# Patient Record
Sex: Male | Born: 2000 | Race: White | Hispanic: No | Marital: Single | Smoking: Never smoker
Health system: Southern US, Academic
[De-identification: ages and names within clinical notes are randomized; demographics above are authoritative.]

## PROBLEM LIST (undated history)

## (undated) DIAGNOSIS — R7303 Prediabetes: Secondary | ICD-10-CM

## (undated) DIAGNOSIS — F419 Anxiety disorder, unspecified: Secondary | ICD-10-CM

## (undated) DIAGNOSIS — K760 Fatty (change of) liver, not elsewhere classified: Secondary | ICD-10-CM

## (undated) DIAGNOSIS — Z9289 Personal history of other medical treatment: Secondary | ICD-10-CM

## (undated) DIAGNOSIS — M51369 Other intervertebral disc degeneration, lumbar region without mention of lumbar back pain or lower extremity pain: Secondary | ICD-10-CM

## (undated) DIAGNOSIS — L83 Acanthosis nigricans: Secondary | ICD-10-CM

## (undated) DIAGNOSIS — I10 Essential (primary) hypertension: Secondary | ICD-10-CM

## (undated) DIAGNOSIS — R7989 Other specified abnormal findings of blood chemistry: Secondary | ICD-10-CM

## (undated) HISTORY — DX: Personal history of other medical treatment: Z92.89

## (undated) HISTORY — DX: Fatty (change of) liver, not elsewhere classified: K76.0

## (undated) HISTORY — DX: Acanthosis nigricans: L83

## (undated) HISTORY — DX: Anxiety disorder, unspecified: F41.9

## (undated) HISTORY — DX: Prediabetes: R73.03

## (undated) HISTORY — PX: TEAR DUCT SURGERY: SHX793

## (undated) HISTORY — DX: Essential (primary) hypertension: I10

## (undated) HISTORY — DX: Other intervertebral disc degeneration, lumbar region without mention of lumbar back pain or lower extremity pain: M51.369

## (undated) HISTORY — DX: Other specified abnormal findings of blood chemistry: R79.89

---
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
## 2021-05-08 ENCOUNTER — Other Ambulatory Visit (HOSPITAL_COMMUNITY): Payer: Self-pay | Admitting: FAMILY PRACTICE

## 2021-05-08 DIAGNOSIS — R7989 Other specified abnormal findings of blood chemistry: Secondary | ICD-10-CM

## 2021-06-17 ENCOUNTER — Inpatient Hospital Stay
Admission: RE | Admit: 2021-06-17 | Discharge: 2021-06-17 | Disposition: A | Discharge status: Home / Self Care | Payer: 59 | Source: Ambulatory Visit | Attending: FAMILY PRACTICE | Admitting: FAMILY PRACTICE

## 2021-06-17 ENCOUNTER — Other Ambulatory Visit: Payer: Self-pay

## 2021-06-17 DIAGNOSIS — R7989 Other specified abnormal findings of blood chemistry: Secondary | ICD-10-CM | POA: Insufficient documentation

## 2021-08-04 ENCOUNTER — Other Ambulatory Visit: Payer: Self-pay

## 2021-08-04 ENCOUNTER — Other Ambulatory Visit: Payer: 59 | Attending: FAMILY PRACTICE

## 2022-03-23 ENCOUNTER — Other Ambulatory Visit (HOSPITAL_COMMUNITY): Payer: Self-pay | Admitting: FAMILY PRACTICE

## 2022-03-23 ENCOUNTER — Inpatient Hospital Stay
Admission: RE | Admit: 2022-03-23 | Discharge: 2022-03-23 | Disposition: A | Discharge status: Home / Self Care | Payer: 59 | Source: Ambulatory Visit | Attending: FAMILY PRACTICE | Admitting: FAMILY PRACTICE

## 2022-03-23 ENCOUNTER — Other Ambulatory Visit: Payer: Self-pay

## 2022-03-23 ENCOUNTER — Inpatient Hospital Stay (HOSPITAL_COMMUNITY)
Admission: RE | Admit: 2022-03-23 | Discharge: 2022-03-23 | Disposition: A | Discharge status: Home / Self Care | Payer: 59 | Source: Ambulatory Visit | Attending: FAMILY PRACTICE | Admitting: FAMILY PRACTICE

## 2022-03-23 DIAGNOSIS — M546 Pain in thoracic spine: Secondary | ICD-10-CM

## 2022-03-23 DIAGNOSIS — R52 Pain, unspecified: Secondary | ICD-10-CM | POA: Insufficient documentation

## 2022-04-17 IMAGING — MR MRI THORACIC SPINE WITHOUT CONTRAST
4 of 5 series · 19 of 48 positions shown · non-contrast
Comparison: None available.

﻿EXAM:  [DATE]   MRI THORACIC SPINE WITHOUT CONTRAST,MRI LUMBAR SPINE WITHOUT CONTRAST
INDICATION: Mid and low back pain, bilateral hip and leg pain.
TECHNIQUE: Noncontrast multiplanar, multisequence MRI was performed.

[Series 7: T1 · sagittal · 5.0mm · 0.78mm/px · 3 of 11 slices shown (1 of 2)]
[im 2/11]
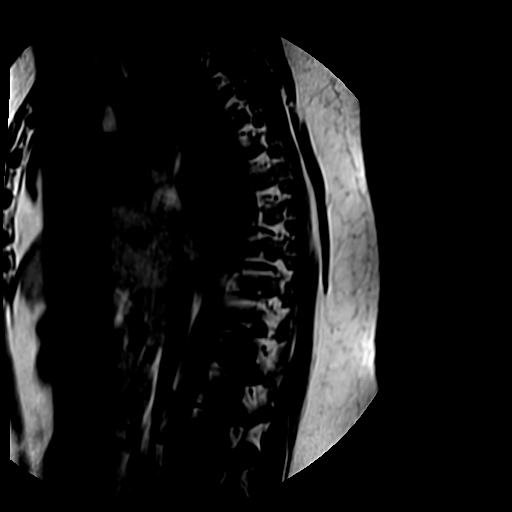
[im 6/11]
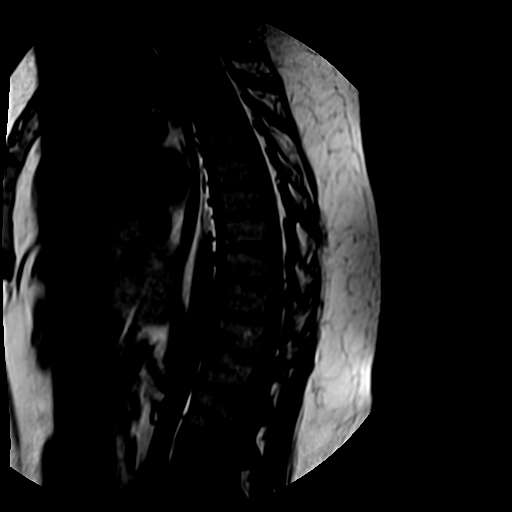
[im 9/11]
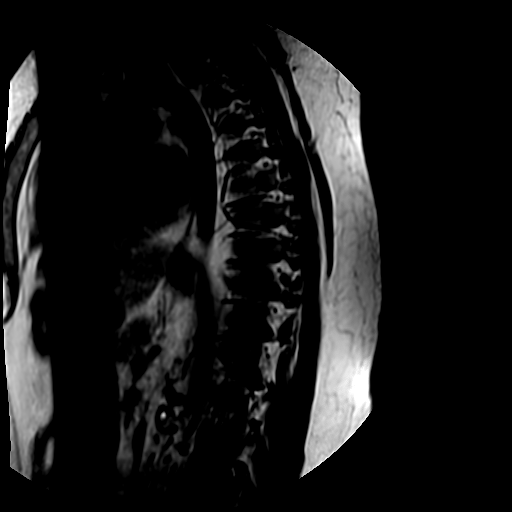

[Series 9: T2 · sagittal · 5.0mm · 0.78mm/px · 9 of 11 slices shown (1 of 2)]
[im 1/11]
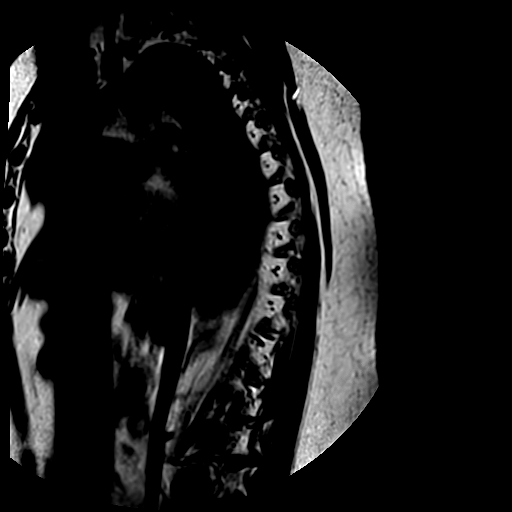
[im 2/11]
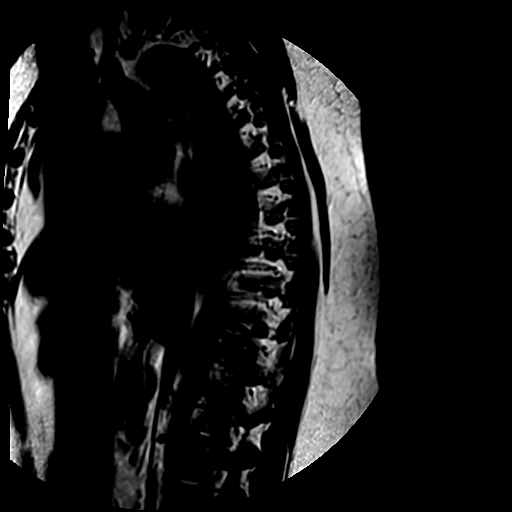
[im 3/11]
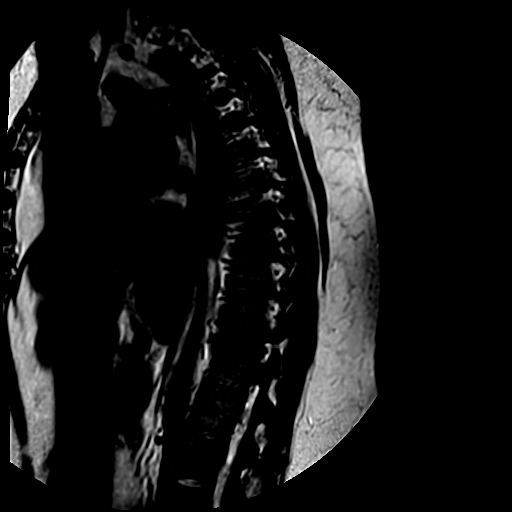
[im 4/11]
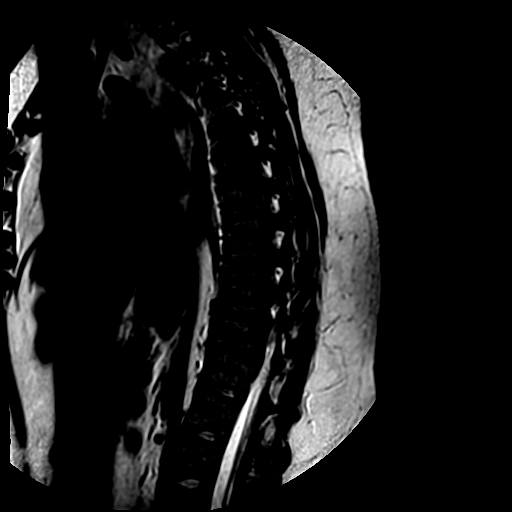
[im 6/11]
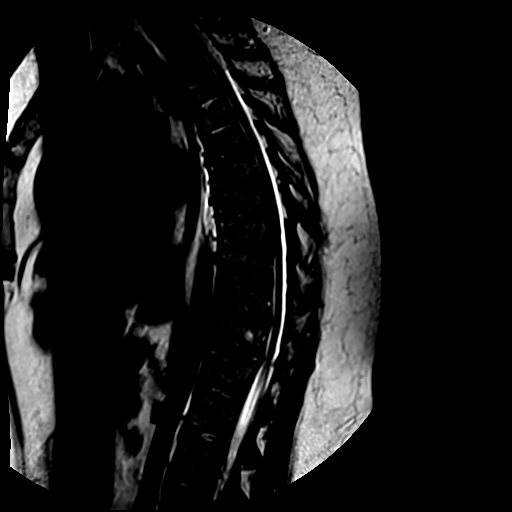
[im 7/11]
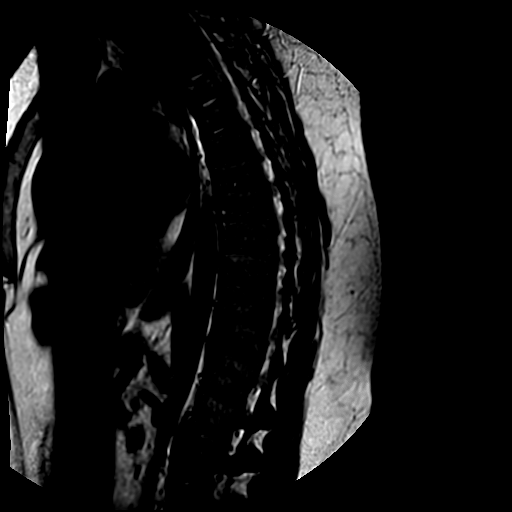
[im 8/11]
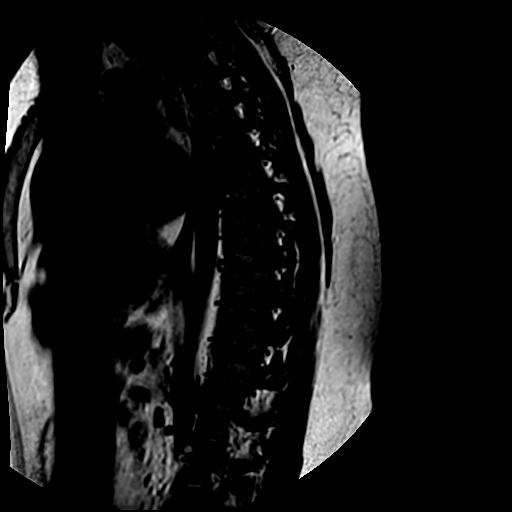
[im 9/11]
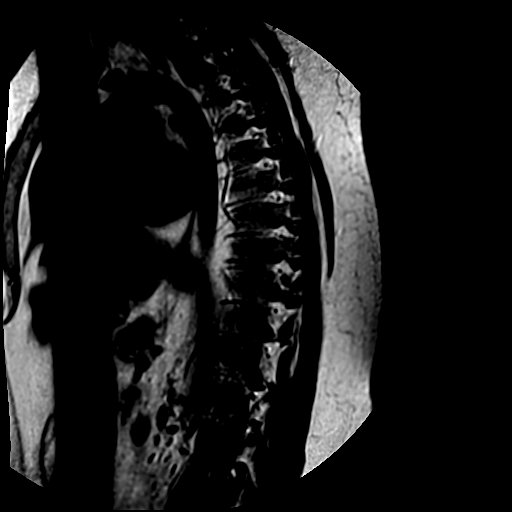
[im 11/11]
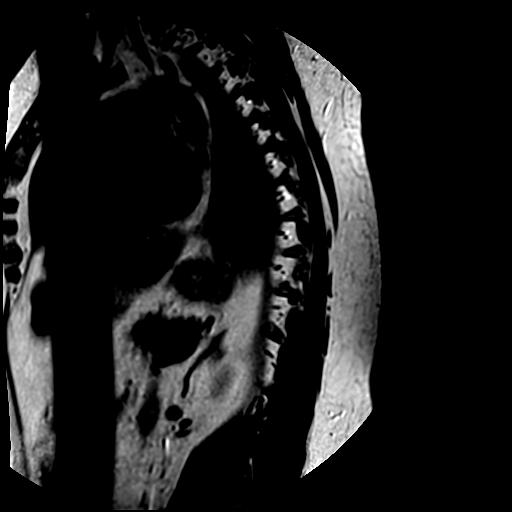

[Series 10: T1 · axial · 5.0mm · 0.45mm/px · z∈[-303,-164]mm · 3 of 13 slices shown (2 of 2)]
[im 2/13]
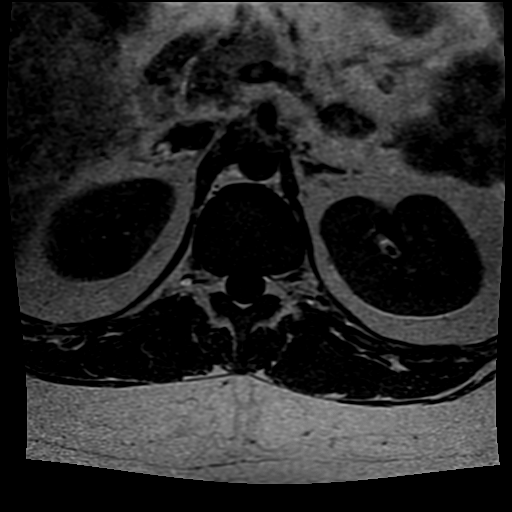
[im 7/13]
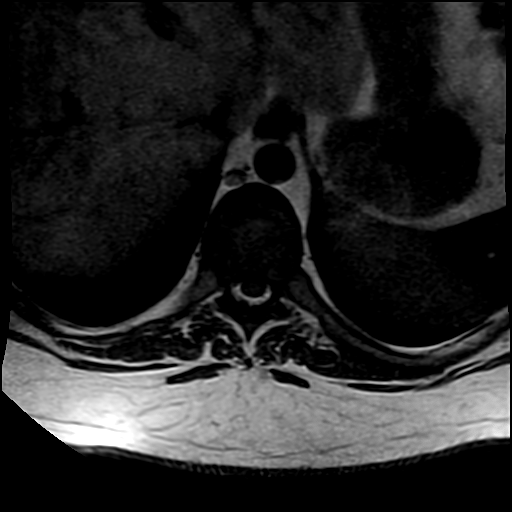
[im 11/13]
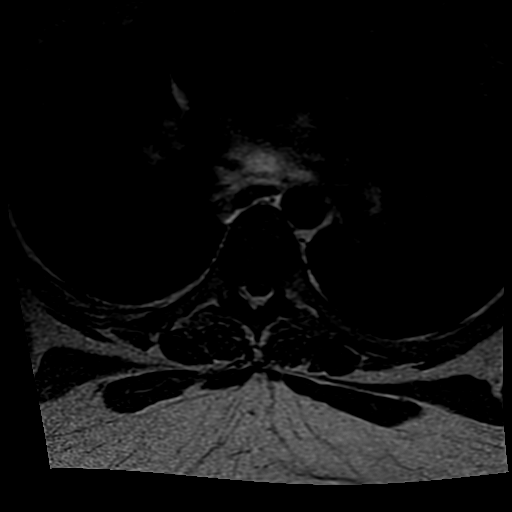

[Series 11: T2 · axial · 5.0mm · 0.45mm/px · z∈[-339,-164]mm · 4 of 12 slices shown (2 of 2)]
[im 1/12]
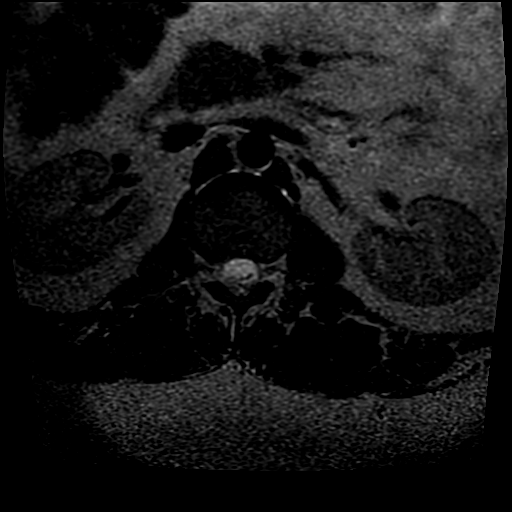
[im 2/12]
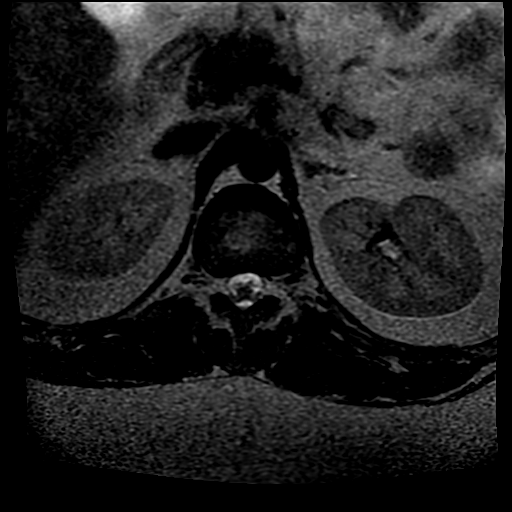
[im 7/12]
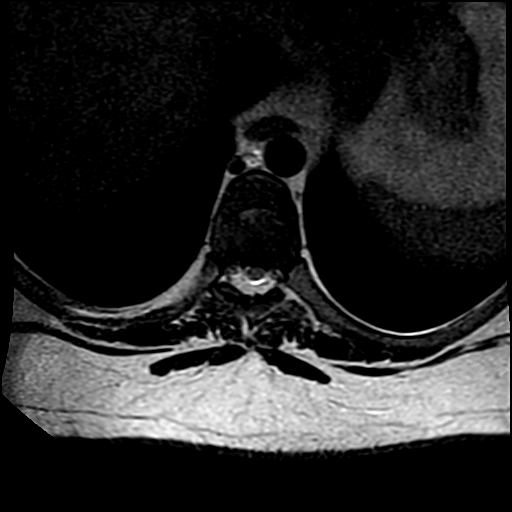
[im 10/12]
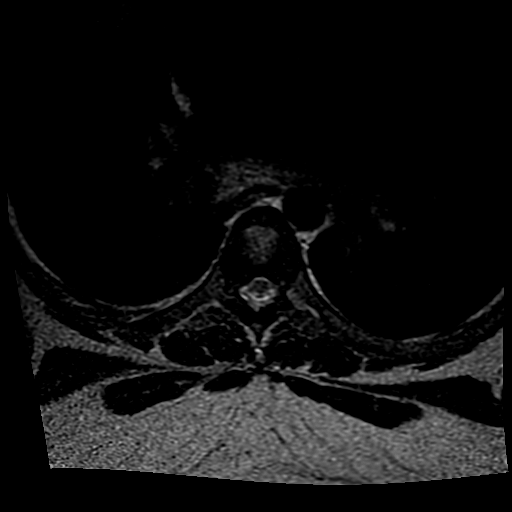

[19 of 48 positions shown; findings below may reference images not displayed]

FINDINGS: This examination is limited by use of the body coil required by the patient’s large size.  As a result, image quality is limited.  

There are mild degenerative changes at multiple levels.  No fracture or malalignment is seen.  

At T10-T11, there is a moderate sized right paratracheal disc herniation with compression of the right side of the thecal sac and possible compression of the right nerve root at this level.  

At L5-S1, there is a moderate sized right paracentral disc herniation with slight compression of the thecal sac and probable compression of the right S1 nerve root.  

There is no fracture, malalignment, significant marrow signal alteration, or spinal stenosis.
IMPRESSION: T10-T11 and L5-S1 disc herniations.

## 2022-04-17 IMAGING — MR MRI LUMBAR SPINE WITHOUT CONTRAST
6 series · 48 of 48 positions shown · non-contrast
Comparison: None available.

﻿EXAM:  [DATE]   MRI THORACIC SPINE WITHOUT CONTRAST,MRI LUMBAR SPINE WITHOUT CONTRAST
INDICATION: Mid and low back pain, bilateral hip and leg pain.
TECHNIQUE: Noncontrast multiplanar, multisequence MRI was performed.

[Series 4: T2 · sagittal · 5.0mm · 1.00mm/px · 5 of 13 slices shown (1 of 3)]
[im 1/13]
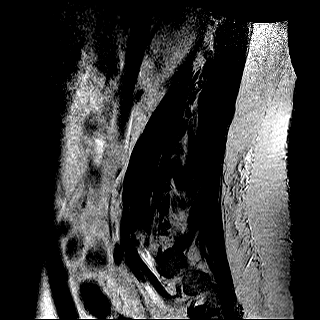
[im 4/13]
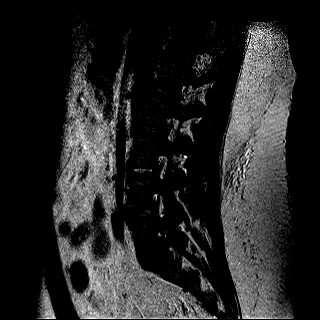
[im 7/13]
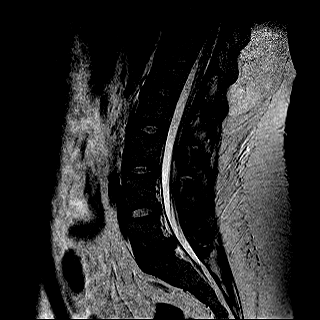
[im 10/13]
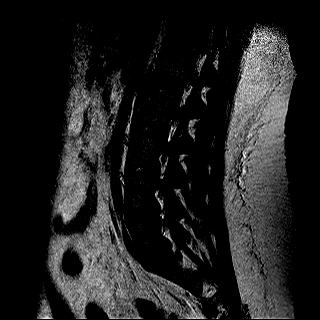
[im 13/13]
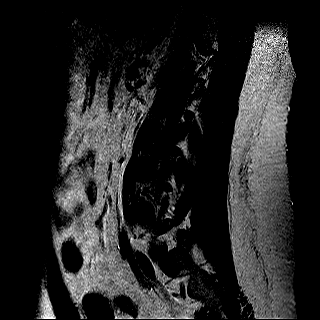

[Series 5: T1 · sagittal · 5.0mm · 1.00mm/px · 6 of 13 slices shown (1 of 2)]
[im 1/13]
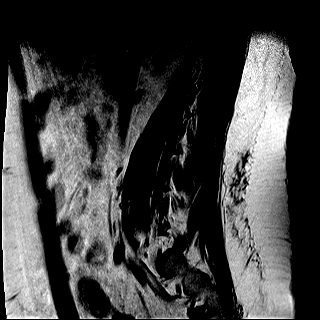
[im 3/13]
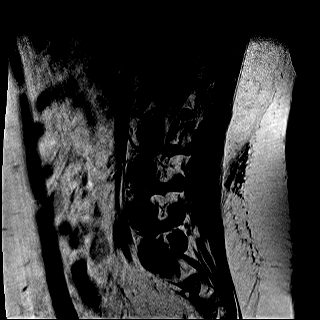
[im 5/13]
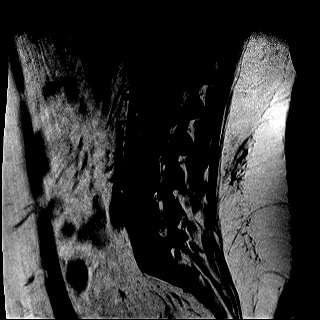
[im 8/13]
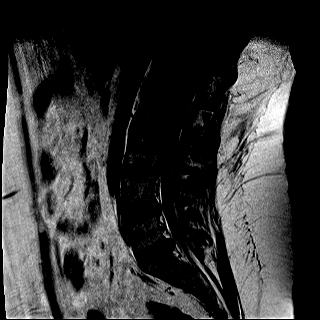
[im 10/13]
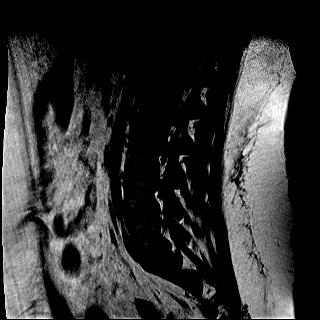
[im 13/13]
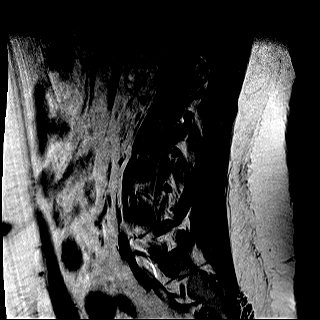

[Series 6: STIR · sagittal · 5.0mm · 1.25mm/px · 6 of 13 slices shown]
[im 1/13]
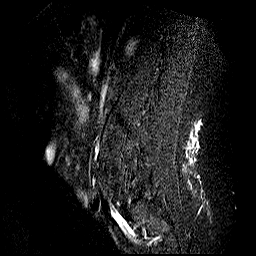
[im 3/13]
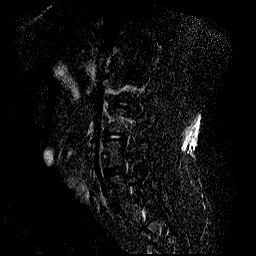
[im 5/13]
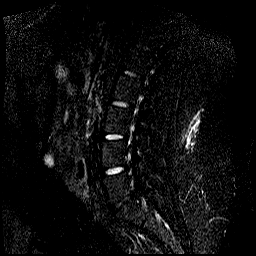
[im 8/13]
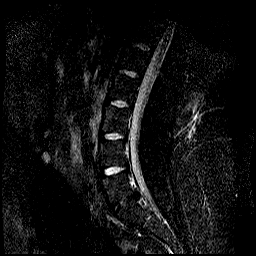
[im 10/13]
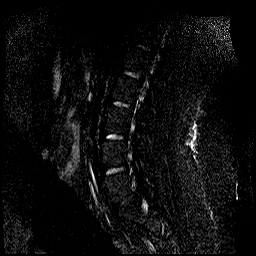
[im 13/13]
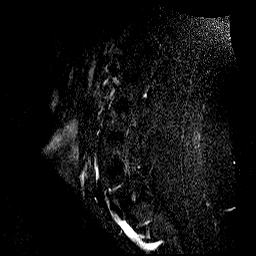

[Series 7: T2 · coronal · 4.0mm · 1.88mm/px · 9 of 20 slices shown (2 of 3)]
[im 1/20]
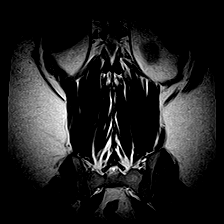
[im 3/20]
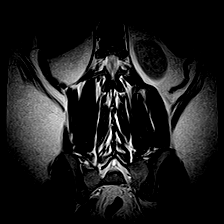
[im 5/20]
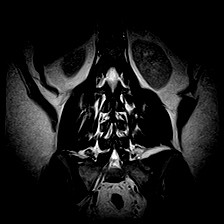
[im 8/20]
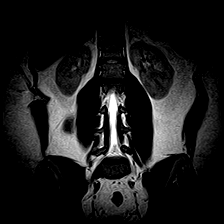
[im 10/20]
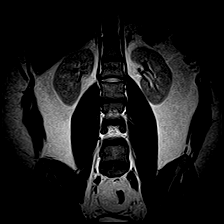
[im 12/20]
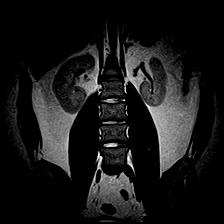
[im 15/20]
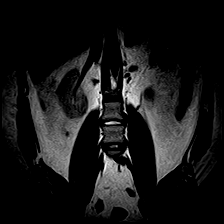
[im 17/20]
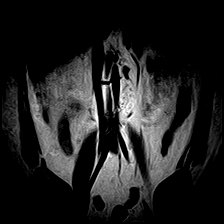
[im 20/20]
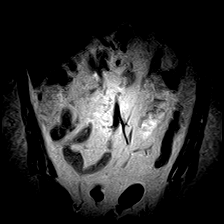

[Series 8: T2 · axial · 5.0mm · 0.89mm/px · z∈[-94,+161]mm · 11 of 25 slices shown (3 of 3)]
[im 1/25]
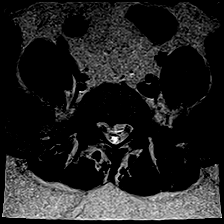
[im 3/25]
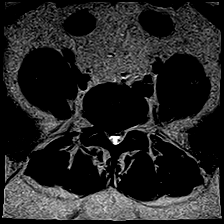
[im 5/25]
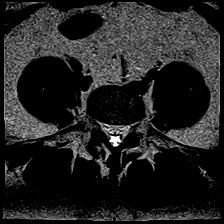
[im 8/25]
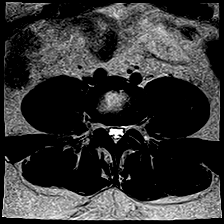
[im 10/25]
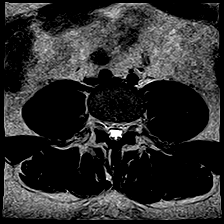
[im 13/25]
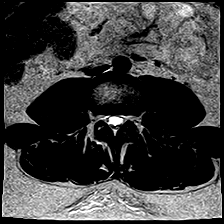
[im 15/25]
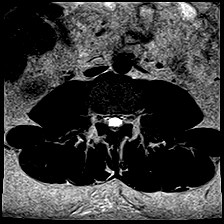
[im 17/25]
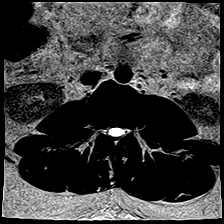
[im 20/25]
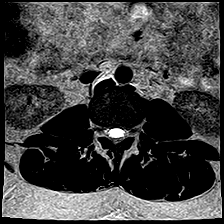
[im 22/25]
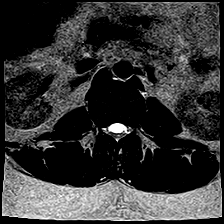
[im 25/25]
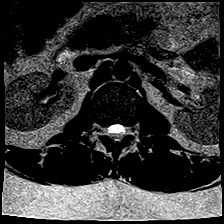

[Series 9: T1 · axial · 5.0mm · 0.89mm/px · z∈[-94,+161]mm · 11 of 25 slices shown (2 of 2)]
[im 1/25]
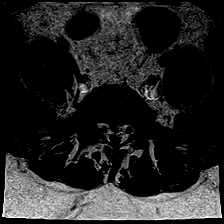
[im 3/25]
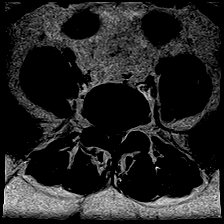
[im 5/25]
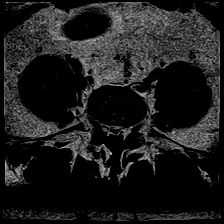
[im 8/25]
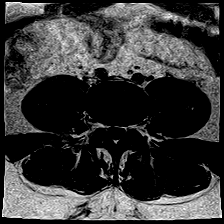
[im 10/25]
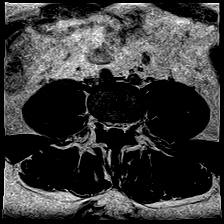
[im 13/25]
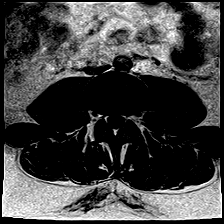
[im 15/25]
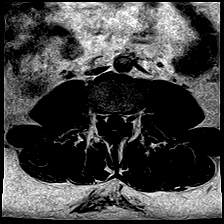
[im 17/25]
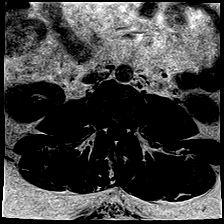
[im 20/25]
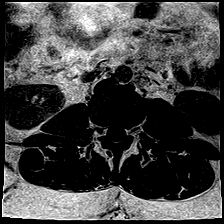
[im 22/25]
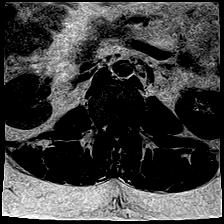
[im 25/25]
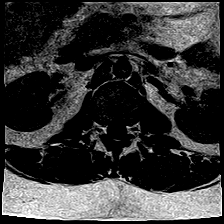

[48 of 48 positions shown; findings below may reference images not displayed]

FINDINGS: This examination is limited by use of the body coil required by the patient’s large size.  As a result, image quality is limited.  

There are mild degenerative changes at multiple levels.  No fracture or malalignment is seen.  

At T10-T11, there is a moderate sized right paratracheal disc herniation with compression of the right side of the thecal sac and possible compression of the right nerve root at this level.  

At L5-S1, there is a moderate sized right paracentral disc herniation with slight compression of the thecal sac and probable compression of the right S1 nerve root.  

There is no fracture, malalignment, significant marrow signal alteration, or spinal stenosis.
IMPRESSION: T10-T11 and L5-S1 disc herniations.

## 2022-04-28 ENCOUNTER — Ambulatory Visit (INDEPENDENT_AMBULATORY_CARE_PROVIDER_SITE_OTHER): Payer: Self-pay

## 2022-04-28 NOTE — Telephone Encounter (Signed)
Patient notified of appt date and time.    Rozanna Cormany, MA

## 2022-05-04 ENCOUNTER — Ambulatory Visit: Payer: BC Managed Care – PPO | Attending: Neurological Surgery | Admitting: Neurological Surgery

## 2022-05-04 ENCOUNTER — Other Ambulatory Visit: Payer: Self-pay

## 2022-05-04 ENCOUNTER — Encounter (INDEPENDENT_AMBULATORY_CARE_PROVIDER_SITE_OTHER): Payer: Self-pay | Admitting: Neurological Surgery

## 2022-05-04 VITALS — BP 110/76 | HR 130 | Temp 97.0°F | Ht 75.0 in | Wt >= 6400 oz

## 2022-05-04 DIAGNOSIS — M5124 Other intervertebral disc displacement, thoracic region: Secondary | ICD-10-CM

## 2022-05-04 DIAGNOSIS — M5117 Intervertebral disc disorders with radiculopathy, lumbosacral region: Secondary | ICD-10-CM

## 2022-05-04 DIAGNOSIS — M5126 Other intervertebral disc displacement, lumbar region: Secondary | ICD-10-CM | POA: Insufficient documentation

## 2022-05-04 DIAGNOSIS — M546 Pain in thoracic spine: Secondary | ICD-10-CM | POA: Insufficient documentation

## 2022-05-04 DIAGNOSIS — R202 Paresthesia of skin: Secondary | ICD-10-CM | POA: Insufficient documentation

## 2022-05-04 DIAGNOSIS — M549 Dorsalgia, unspecified: Secondary | ICD-10-CM | POA: Insufficient documentation

## 2022-05-04 DIAGNOSIS — M5416 Radiculopathy, lumbar region: Secondary | ICD-10-CM | POA: Insufficient documentation

## 2022-05-04 NOTE — Progress Notes (Signed)
[facility] Department of Neurosurgery  New Outpatient/Consult    Doyle Askew  Date of Service: 05/04/2022  Referring physician: No referring provider defined for this encounter.    Gender: male  Handedness: Right handed  Marital Status: Single   Job Title (or Former Job): Quarry manager Complaint:   Chief Complaint   Patient presents with    New Patient     History is provided by patient    History of Present Illness  Corey Lopez is a 22 y/o right handed male here today for evaluation thoracic/lumbar spine. Initially reports: mid and low back pain along right numbness  Prior spine surgeries: no  has been occurring for 4 weeks without inciting event-  Located mid low back sharp muscle spasm pain  Radiates to RLE numbness  Occurs with prolong activity: walking, sitting and standing.  Bending makes worse.   Relieved by changing positions  Numbness- RLE  Weakness- no  No assistive devices.   Bowel/bladder dysfunction-no   Falls/injuries-no    Conservative treatments:   Meds- robaxin helps, Tylenol and Naproxen with some benefit.   Heat/ice- heat helps.  Chiropractic- no  PT- nothing recent.   TENs- nothing reported  injections/ pain clinic- no  Prior testing EMG/NCS- no    Today, the patient is doing fair. Pain is rated as a 3/10. Patient denies fevers, nausea, vomiting, diarrhea, SOB and bowel or bladder dysfunction. No reported falls, seizures, blurry vision or headaches. No Tobacco use.    Past History    Current Outpatient Medications:     busPIRone (BUSPAR) 15 mg Oral Tablet, , Disp: , Rfl:     ketoconazole (NIZORAL) 2 % Shampoo, , Disp: , Rfl:     metFORMIN (GLUCOPHAGE) 500 mg Oral Tablet, , Disp: , Rfl:     methocarbamoL (ROBAXIN) 750 mg Oral Tablet, , Disp: , Rfl:     omeprazole (PRILOSEC) 40 mg Oral Capsule, Delayed Release(E.C.), , Disp: , Rfl:     venlafaxine (EFFEXOR XR) 37.5 mg Oral Capsule, Sust. Release 24 hr, , Disp: , Rfl:    No Known Allergies  Past Medical History:   Diagnosis Date    Acanthosis  nigricans      No past surgical history on file.    Family History  Family Medical History:       Problem Relation (Age of Onset)    Diabetes type II Mother, Father    Goiter Mother    Healthy Brother    Other cancer Maternal Grandmother    Thyroid Disease Maternal Grandmother          Social History  Social History     Socioeconomic History    Marital status: Single   Tobacco Use    Smoking status: Never    Smokeless tobacco: Never   Substance and Sexual Activity    Alcohol use: Yes     Comment: occasionally    Drug use: Never   Social History Narrative    02/19/16 - Lives at home with mother, father, and younger brother.  Sophomore in high school.     Review of Systems  Other than ROS in the HPI, all other systems were negative.    Examination  BP 110/76   Pulse (!) 130   Temp 36.1 C (97 F) (Thermal Scan)   Ht 1.905 m (6\' 3" )   Wt (!) 188 kg (414 lb 11 oz)   BMI 51.83 kg/m     Constitutional:Well groomed, in no apparent  distress  Head:  Normocephalic/ Atraumatic.   Eyes: Conjunctiva clear, + red reflex, optic discs and posterior segments - difficult to visualize, no obvious abnormalities;   ENT:  Tongue midline, trachea midline   Cardiovascular:    Carotid arteries: Negative for carotid bruit bilaterally  Auscultation: Regular rate and rhythm  Peripheral vascular system: Pulses palpable, cap refill intact  Respiratory: CTA bilaterally, Unlabored  Gastrointestinal: BS x 4  Musculoskeletal  Gait and Station: slow and steady with no ataxia  Strength: Age appropriate  Arm Right Left Leg Right Left   Deltoid (shoulder abduction) 5/5 5/5 Hip Flexion 5/5 5/5   Biceps (Elbow flexion) 5/5 5/5 Knee Flexion 5/5 5/5   Triceps (Elbow extension) 5/5 5/5 Knee Extension 5/5 5/5   Grip 5/5 5/5 Foot Dorsiflexion 5/5 5/5      Foot Plantarflexion 5/5 5/5   Muscle tone (upper extremities): WNL  Muscle tone (lower extremities): WNL  Sensory: intact to light touch, Right thigh numbness  DTR's:    Brachio-  radialis Biceps Triceps  Patellar Achilles  Hoffman's   Right 2+ 2+ 2+ 2+ 2+  Not present   Left 2+ 2+ 2+ 2+ 2+  Not present   Ankle clonus: Negative  Coordination: Normal rapid alternating movements and finger to nose intact. No pronator drift.   Romberg: maintains balance with eyes closed   Straight leg raise: Negative   Musculoskeletal tenderness: negative  Neurological  Level of consciousness: Alert and oriented  Recent and remote memory: Good recall and able to follow commands  Attention span and concentration: Normal in conversation  Language/Speech: No aphasia or dysarthria  Fund of knowledge: Appropriate in this setting  Cranial Nerves  2nd: PERRL  3rd,4th,6th:  EOMI, no nystagmus  5th: Facial sensation intact  7th: No facial asymmetry or droop  8th: Hearing grossly intact  9th/ 10th: Palate symmetric  11th: Normal shoulder shrug  12th: Tongue midline    Data reviewed  MRI thoracic and lumbar reviewed and discussed with patient by Dr. Cherrie DistanceVoelker. Official report in EPIC.     Discussions with other providers:   Prior notes and charts reviewed.     Assessment:      ICD-10-CM    1. Back pain, unspecified back location, unspecified back pain laterality, unspecified chronicity  M54.9 External Referral to Physical Therapy for Orthopaedics Spine Patients      2. Right leg paresthesias  R20.2 External Referral to Physical Therapy for Orthopaedics Spine Patients      3. Thoracic back pain  M54.6 External Referral to Physical Therapy for Orthopaedics Spine Patients      4. Lumbar disc herniation  M51.26 External Referral to Physical Therapy for Orthopaedics Spine Patients      5. Lumbar radiculopathy  M54.16 External Referral to Physical Therapy for Orthopaedics Spine Patients        Treatment Plan  - See Dr. Nickie RetortVoelker's note for elaboration of plan. A physical therapy prescription was provided to the patient today.    - Telephone visit in 6 weeks after a trial of PT. Come back earlier if problems develop.     - Continue Medical Management (Diet,  Exercise, Medication).    - The patient has been advised to follow up with their PCP in regards any chronic medical conditions and any non-neurosurgical symptoms that they may have. A copy of the note from today's clinic appointment will be sent to the patient's PCP Telitha D Glasscock, DO on file (confirmed during visit)  The patient was seen as a shared visit with the co-signing faculty.    Doreene Nest, APRN,FNP-BC 05/04/2022, 14:57    With Dr. Cherrie Distance    I personally saw and examined the patient. See the mid-level provider's note for additional details. My findings are as follows:     History:  1 month history of LBP to anterior right thigh after lifting couch. LBP>leg pain.     Exam:  motor and sensory intact.     Studies:  MRI shows right L5-S1 HNP, small right T10-11 HNP.     Imp:  right L5-S1 HNP.      Plan:  PT, phone visit in 6 weeks.        Barnabas Lister, MD 05/04/2022, 14:55

## 2022-05-04 NOTE — Progress Notes (Signed)
I personally saw and examined the patient. See the mid-level provider's note for additional details. My findings are as follows:    History:  1 month history of LBP to anterior right thigh after lifting couch. LBP>leg pain.    Exam:  motor and sensory intact.    Studies:  MRI shows right L5-S1 HNP, small right T10-11 HNP.    Imp:  right L5-S1 HNP.     Plan:  PT, phone visit in 6 weeks.      Barnabas Lister, MD 05/04/2022, 14:55

## 2022-06-15 ENCOUNTER — Ambulatory Visit (INDEPENDENT_AMBULATORY_CARE_PROVIDER_SITE_OTHER): Payer: BC Managed Care – PPO | Admitting: Neurological Surgery

## 2022-11-05 ENCOUNTER — Encounter (INDEPENDENT_AMBULATORY_CARE_PROVIDER_SITE_OTHER): Payer: Self-pay | Admitting: PHYSICIAN ASSISTANT

## 2022-11-12 ENCOUNTER — Encounter (INDEPENDENT_AMBULATORY_CARE_PROVIDER_SITE_OTHER): Payer: Self-pay | Admitting: PHYSICIAN ASSISTANT

## 2022-12-02 ENCOUNTER — Ambulatory Visit (INDEPENDENT_AMBULATORY_CARE_PROVIDER_SITE_OTHER): Payer: BC Managed Care – PPO | Admitting: PHYSICIAN ASSISTANT

## 2022-12-02 ENCOUNTER — Other Ambulatory Visit: Payer: Self-pay

## 2022-12-02 ENCOUNTER — Encounter (INDEPENDENT_AMBULATORY_CARE_PROVIDER_SITE_OTHER): Payer: Self-pay | Admitting: PHYSICIAN ASSISTANT

## 2022-12-02 VITALS — BP 170/94 | HR 99 | Temp 97.5°F | Resp 19 | Ht 75.0 in | Wt >= 6400 oz

## 2022-12-02 DIAGNOSIS — R030 Elevated blood-pressure reading, without diagnosis of hypertension: Secondary | ICD-10-CM

## 2022-12-02 DIAGNOSIS — Z23 Encounter for immunization: Secondary | ICD-10-CM

## 2022-12-02 DIAGNOSIS — F419 Anxiety disorder, unspecified: Secondary | ICD-10-CM

## 2022-12-02 DIAGNOSIS — J029 Acute pharyngitis, unspecified: Secondary | ICD-10-CM

## 2022-12-02 DIAGNOSIS — Z634 Disappearance and death of family member: Secondary | ICD-10-CM

## 2022-12-02 MED ORDER — BUSPIRONE 15 MG TABLET
15.0000 mg | ORAL_TABLET | Freq: Three times a day (TID) | ORAL | 0 refills | Status: DC
Start: 2022-12-02 — End: 2023-05-19

## 2022-12-02 MED ORDER — NICOTINE 21 MG/24 HR DAILY TRANSDERMAL PATCH
21.0000 mg | MEDICATED_PATCH | Freq: Every day | TRANSDERMAL | 1 refills | Status: DC
Start: 2022-12-02 — End: 2022-12-02

## 2022-12-02 MED ORDER — VENLAFAXINE ER 37.5 MG CAPSULE,EXTENDED RELEASE 24 HR
37.5000 mg | ORAL_CAPSULE | Freq: Every day | ORAL | 0 refills | Status: DC
Start: 2022-12-02 — End: 2022-12-03

## 2022-12-02 MED ORDER — VENLAFAXINE ER 150 MG CAPSULE,EXTENDED RELEASE 24 HR
150.0000 mg | ORAL_CAPSULE | Freq: Every day | ORAL | 0 refills | Status: DC
Start: 2022-12-02 — End: 2022-12-03

## 2022-12-02 NOTE — Nursing Note (Signed)
12/02/22 1507   Depression Screen   Little interest or pleasure in doing things. 0   Feeling down, depressed, or hopeless 1   PHQ 2 Total 1

## 2022-12-02 NOTE — Progress Notes (Signed)
FAMILY MEDICINE, MEDICAL OFFICE BUILDING  [address] [place] [address]      Progress Note    Corey Lopez  11-16-2000  [id number]    Date of Service: 12/02/2022  3:00 PM EST    Chief complaint:   Chief Complaint   Patient presents with    Follow Up     Patient complains of sore throat and a cough for a week. Patient is requesting medication refills.        Subjective:     This is a case of a 22 y.o. year old male who comes in today for follow up and c/o sick for 7 days. Pts father died 23-Sep-2022. Pt. Feels good support system with GF. Pt noticed increase in anxiety and 1 anxiety attack since dads death.     Review of Systems   Constitutional:  Negative for chills and fever.   HENT:  Positive for ear pain, postnasal drip and sore throat.    Respiratory:  Positive for cough. Negative for shortness of breath and wheezing.         Sorethroat for 1 week with tonsillar stones  Cough-productive clear   Gastrointestinal:  Negative for abdominal pain, constipation, diarrhea, nausea and vomiting.   Neurological:  Negative for dizziness and headaches.   Psychiatric/Behavioral:  The patient is nervous/anxious.       All systems reviewed and negative except for what is noted above.  Patient Active Problem List   Diagnosis    Hyperinsulinism        Past Surgical History:   Procedure Laterality Date    TEAR DUCT SURGERY      As an infant with Dr.Blaydes           Current Outpatient Medications   Medication Sig    amoxicillin (AMOXIL) 500 mg Oral Capsule 2 tablets by mouth twice a day    busPIRone (BUSPAR) 15 mg Oral Tablet Take 1 Tablet (15 mg total) by mouth Three times a day    hydrOXYzine HCL (ATARAX) 10 mg Oral Tablet Take 1 Tablet (10 mg total) by mouth Once per day as needed for Anxiety    hydrOXYzine pamoate (VISTARIL) 25 mg Oral Capsule Take 1 Capsule (25 mg total) by mouth Three times a day as needed for Anxiety for up to 90 days    ketoconazole (NIZORAL) 2 % Shampoo     metFORMIN (GLUCOPHAGE) 500 mg Oral Tablet  Take 1 Tablet (500 mg total) by mouth Three times daily with meals    methocarbamoL (ROBAXIN) 750 mg Oral Tablet     omeprazole (PRILOSEC) 40 mg Oral Capsule, Delayed Release(E.C.) Take 1 Capsule (40 mg total) by mouth Once a day    venlafaxine (EFFEXOR XR) 150 mg Oral Capsule, Sust. Release 24 hr Take 1 Capsule (150 mg total) by mouth Once a day for 30 days    venlafaxine (EFFEXOR XR) 75 mg Oral Capsule, Sust. Release 24 hr Take 1 Capsule (75 mg total) by mouth Once a day for 30 days       Objective:     BP (!) 170/94   Pulse 99   Temp 36.4 C (97.5 F) (Temporal)   Resp 19   Ht 1.905 m (6\' 3" )   Wt (!) 188 kg (414 lb)   SpO2 96%   BMI 51.75 kg/m      Physical Exam  Constitutional:       Appearance: Normal appearance.   HENT:  Head: Normocephalic and atraumatic.      Right Ear: Tympanic membrane normal.      Left Ear: Tympanic membrane normal.      Nose: Nose normal.      Mouth/Throat:      Mouth: Mucous membranes are moist.      Pharynx: Oropharynx is clear. Posterior oropharyngeal erythema present.      Comments: Tonsils enlarged bilaterally 2+ with erythema noted and on uvula and palate  Eyes:      Extraocular Movements: Extraocular movements intact.      Pupils: Pupils are equal, round, and reactive to light.   Cardiovascular:      Rate and Rhythm: Normal rate and regular rhythm.      Heart sounds: Normal heart sounds.   Pulmonary:      Effort: Pulmonary effort is normal.      Breath sounds: Normal breath sounds.   Musculoskeletal:         General: Normal range of motion.      Cervical back: Normal range of motion and neck supple.   Skin:     General: Skin is warm and dry.   Neurological:      General: No focal deficit present.   Psychiatric:         Mood and Affect: Mood normal.         Thought Content: Thought content normal.         Judgment: Judgment normal.      Comments: Pt. Crying at times in exam, good eye contact and interacts well       Assessment & Plan  Need for vaccination    Orders:     Flu Vaccine,6 month-adult,0.5 mL IM (Admin)    Acute pharyngitis  Salt water, replace toothbrush in 2 days  Amoxil 500 mg 1 tab bid for 10 days       Anxiety  D/c atarax 10 mg   Start vistaril 25 mg one tid and hs prn  Increase Effexor 225mg  1 tab once a day       Elevated BP without diagnosis of hypertension  Discuss with patient BP goals and elevated will start BP meds       Bereavement due to life event  Med changes today, see med list  Advise journaling, continue to confide in GF         Other orders    busPIRone (BUSPAR) 15 mg Oral Tablet; Take 1 Tablet (15 mg total) by mouth Three times a day    venlafaxine (EFFEXOR XR) 150 mg Oral Capsule, Sust. Release 24 hr; Take 1 Capsule (150 mg total) by mouth Once a day for 30 days    venlafaxine (EFFEXOR XR) 75 mg Oral Capsule, Sust. Release 24 hr; Take 1 Capsule (75 mg total) by mouth Once a day for 30 days    hydrOXYzine pamoate (VISTARIL) 25 mg Oral Capsule; Take 1 Capsule (25 mg total) by mouth Three times a day as needed for Anxiety for up to 90 days    amoxicillin (AMOXIL) 500 mg Oral Capsule; 2 tablets by mouth twice a day      The patient was given the opportunity to ask questions and those questions were answered to the patient's satisfaction. The patient was encouraged to call with any additional questions or concerns. Discussed with patient effects and side effects of medications. A good faith effort was made to reconcile the patient's medications.     Work excuse 11/26/2022-12/04/2022    Follow  up: Return in about 3 weeks (around 12/23/2022).      Dewayne Shorter, PA-C

## 2022-12-02 NOTE — Nursing Note (Signed)
12/02/22 1507   Recent Weight Change   Have you had a recent unexplained weight loss or gain? N   Domestic Violence   Because we are aware of abuse and domestic violence today, we ask all patients: Are you being hurt, hit, or frightened by anyone at your home or in your life?  N   Basic Needs   Do you have any basic needs within your home that are not being met? (such as Food, Shelter, [employer] fast streamer, Tranportation, paying for bills and/or medications) N

## 2022-12-03 ENCOUNTER — Other Ambulatory Visit (INDEPENDENT_AMBULATORY_CARE_PROVIDER_SITE_OTHER): Payer: Self-pay | Admitting: PHYSICIAN ASSISTANT

## 2022-12-03 MED ORDER — VENLAFAXINE ER 150 MG CAPSULE,EXTENDED RELEASE 24 HR
150.0000 mg | ORAL_CAPSULE | Freq: Every day | ORAL | 0 refills | Status: DC
Start: 2022-12-03 — End: 2023-01-13

## 2022-12-03 MED ORDER — AMOXICILLIN 500 MG CAPSULE
ORAL_CAPSULE | ORAL | 0 refills | Status: DC
Start: 2022-12-03 — End: 2022-12-23

## 2022-12-03 MED ORDER — VENLAFAXINE ER 75 MG CAPSULE,EXTENDED RELEASE 24 HR
75.0000 mg | ORAL_CAPSULE | Freq: Every day | ORAL | 0 refills | Status: DC
Start: 2022-12-03 — End: 2023-01-13

## 2022-12-03 MED ORDER — VENLAFAXINE ER 75 MG CAPSULE,EXTENDED RELEASE 24 HR
75.0000 mg | ORAL_CAPSULE | Freq: Every day | ORAL | 0 refills | Status: DC
Start: 2022-12-03 — End: 2022-12-03

## 2022-12-03 MED ORDER — HYDROXYZINE PAMOATE 25 MG CAPSULE
25.0000 mg | ORAL_CAPSULE | Freq: Three times a day (TID) | ORAL | 0 refills | Status: DC | PRN
Start: 2022-12-03 — End: 2023-01-13

## 2022-12-03 MED ORDER — AMOXICILLIN 500 MG CAPSULE
ORAL_CAPSULE | ORAL | 0 refills | Status: DC
Start: 2022-12-03 — End: 2022-12-03

## 2022-12-03 MED ORDER — VENLAFAXINE ER 150 MG CAPSULE,EXTENDED RELEASE 24 HR
150.0000 mg | ORAL_CAPSULE | Freq: Every day | ORAL | 0 refills | Status: DC
Start: 2022-12-03 — End: 2022-12-03

## 2022-12-23 ENCOUNTER — Ambulatory Visit (INDEPENDENT_AMBULATORY_CARE_PROVIDER_SITE_OTHER): Payer: BC Managed Care – PPO | Admitting: PHYSICIAN ASSISTANT

## 2022-12-23 ENCOUNTER — Other Ambulatory Visit (INDEPENDENT_AMBULATORY_CARE_PROVIDER_SITE_OTHER): Payer: Self-pay | Admitting: PHYSICIAN ASSISTANT

## 2022-12-23 ENCOUNTER — Other Ambulatory Visit: Payer: Self-pay

## 2022-12-23 ENCOUNTER — Telehealth (INDEPENDENT_AMBULATORY_CARE_PROVIDER_SITE_OTHER): Payer: Self-pay | Admitting: PHYSICIAN ASSISTANT

## 2022-12-23 ENCOUNTER — Encounter (INDEPENDENT_AMBULATORY_CARE_PROVIDER_SITE_OTHER): Payer: Self-pay | Admitting: PHYSICIAN ASSISTANT

## 2022-12-23 VITALS — BP 149/59 | HR 80 | Temp 97.6°F | Resp 19 | Ht 75.0 in | Wt >= 6400 oz

## 2022-12-23 DIAGNOSIS — H9192 Unspecified hearing loss, left ear: Secondary | ICD-10-CM

## 2022-12-23 DIAGNOSIS — H6693 Otitis media, unspecified, bilateral: Secondary | ICD-10-CM

## 2022-12-23 DIAGNOSIS — H60503 Unspecified acute noninfective otitis externa, bilateral: Secondary | ICD-10-CM

## 2022-12-23 DIAGNOSIS — H60509 Unspecified acute noninfective otitis externa, unspecified ear: Secondary | ICD-10-CM

## 2022-12-23 DIAGNOSIS — H6690 Otitis media, unspecified, unspecified ear: Secondary | ICD-10-CM

## 2022-12-23 DIAGNOSIS — F419 Anxiety disorder, unspecified: Secondary | ICD-10-CM

## 2022-12-23 MED ORDER — LIDOCAINE HCL 10 MG/ML (1 %) INJECTION SOLUTION
1.0000 g | INTRAMUSCULAR | Status: AC
Start: 2022-12-23 — End: 2022-12-23
  Administered 2022-12-23: 1 g via INTRAMUSCULAR

## 2022-12-23 MED ORDER — DEXAMETHASONE SODIUM PHOSPHATE 4 MG/ML INJECTION SOLUTION
4.0000 mg | INTRAMUSCULAR | Status: AC
Start: 2022-12-23 — End: 2022-12-23
  Administered 2022-12-23: 4 mg via INTRAMUSCULAR

## 2022-12-23 MED ORDER — CEFUROXIME AXETIL 500 MG TABLET
500.0000 mg | ORAL_TABLET | Freq: Two times a day (BID) | ORAL | 0 refills | Status: DC
Start: 2022-12-23 — End: 2023-01-13

## 2022-12-23 MED ORDER — CIPROFLOXACIN 0.2 %-HYDROCORTISONE 1 % EAR DROPS,SUSPENSION
3.0000 [drp] | Freq: Two times a day (BID) | OTIC | 1 refills | Status: DC
Start: 2022-12-23 — End: 2022-12-23

## 2022-12-23 MED ORDER — CIPROFLOXACIN 0.3 %-DEXAMETHASONE 0.1 % EAR DROPS,SUSPENSION
4.0000 [drp] | Freq: Two times a day (BID) | OTIC | 0 refills | Status: DC
Start: 2022-12-23 — End: 2023-01-13
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
## 2022-12-23 NOTE — Telephone Encounter (Signed)
Order updated due to insurance coverage

## 2022-12-23 NOTE — Nursing Note (Signed)
Received call from [facility] and states patient's insurance will not cover the ear drops that was prescribed but they will cover Ciprodex drops or Cordosporin drops if provider would like to change

## 2022-12-23 NOTE — Progress Notes (Signed)
FAMILY MEDICINE, MEDICAL OFFICE BUILDING  [address]  [place] [address]      Progress Note    Corey Lopez  [birth year]  [id number]    Date of Service: 12/23/2022  9:30 AM EST    Chief complaint:   Chief Complaint   Patient presents with    Ear Pain     Patient complains of left ear pain for 1 week. Patient reports decreased hearing.     Follow Up     Patient is here for CDM.        Subjective:     This is a case of a 22 y.o. year old male who comes in today for follow up. Pt.just recently started his new anxiety meds due to problems with insurance. Pt.reports feeling ok other wise except left ear pain for 7 days, ear feels hot, and muffled hearing, and used OTC drops without relief. Pt.denies any episodes of fast heart rate, or elevated BP      Review of Systems   Constitutional:  Negative for chills, fatigue and fever.   HENT:  Positive for ear pain, hearing loss and postnasal drip. Negative for ear discharge, nosebleeds, sinus pressure, sneezing and sore throat.    Respiratory:  Negative for shortness of breath and wheezing.    Cardiovascular:  Negative for chest pain and palpitations.   Gastrointestinal:  Negative for abdominal pain, diarrhea, nausea and vomiting.   Neurological:  Positive for headaches.   Psychiatric/Behavioral:  Negative for agitation and sleep disturbance.         Pt.reports he has had one anxiety attack since last visit after receiving gift his dad had ordered before he passed away.      Current Outpatient Medications   Medication Sig    busPIRone (BUSPAR) 15 mg Oral Tablet Take 1 Tablet (15 mg total) by mouth Three times a day    cefuroxime (CEFTIN) 500 mg Oral Tablet Take 1 Tablet (500 mg total) by mouth Twice daily    ciprofloxacin-dexAMETHasone (CIPRODEX) 0.3-0.1 % Otic Drops, Suspension Instill 4 Drops into both ears Twice daily for 7 days    hydrOXYzine HCL (ATARAX) 10 mg Oral Tablet Take 1 Tablet (10 mg total) by mouth Once per day as needed for Anxiety    hydrOXYzine  pamoate (VISTARIL) 25 mg Oral Capsule Take 1 Capsule (25 mg total) by mouth Three times a day as needed for Anxiety for up to 90 days    ketoconazole (NIZORAL) 2 % Shampoo     metFORMIN (GLUCOPHAGE) 500 mg Oral Tablet Take 1 Tablet (500 mg total) by mouth Three times daily with meals    methocarbamoL (ROBAXIN) 750 mg Oral Tablet     omeprazole (PRILOSEC) 40 mg Oral Capsule, Delayed Release(E.C.) Take 1 Capsule (40 mg total) by mouth Once a day    venlafaxine (EFFEXOR XR) 150 mg Oral Capsule, Sust. Release 24 hr Take 1 Capsule (150 mg total) by mouth Once a day for 30 days (Take along with the venlafaxine 75mg  capsule)    venlafaxine (EFFEXOR XR) 75 mg Oral Capsule, Sust. Release 24 hr Take 1 Capsule (75 mg total) by mouth Once a day for 30 days (Take along with the venlafaxine 150mg  capsule)       Objective:     BP (!) 149/59 (Site: Right Arm, Patient Position: Sitting, Cuff Size: Adult Large)   Pulse 80   Temp 36.4 C (97.6 F) (Temporal)   Resp 19   Ht 1.905 m (6\' 3" )  Wt (!) 189 kg (417 lb)   SpO2 95%   BMI 52.12 kg/m       Physical Exam  Vitals and nursing note reviewed.   Constitutional:       General: He is not in acute distress.     Appearance: Normal appearance. He is normal weight.   HENT:      Head: Normocephalic and atraumatic.      Ears:      Comments: Left external ear with edema and erythema and unable to visualize the TM, very tender with exam,,Right ear with clear external canal and mild erythema of TM pt reports hearing is muffled today at exam and a lot of ear pressure     Nose: Congestion present.      Mouth/Throat:      Mouth: Mucous membranes are moist.      Pharynx: Posterior oropharyngeal erythema present. No oropharyngeal exudate.   Eyes:      Extraocular Movements: Extraocular movements intact.      Conjunctiva/sclera: Conjunctivae normal.   Cardiovascular:      Rate and Rhythm: Normal rate and regular rhythm.      Heart sounds: Normal heart sounds.   Pulmonary:      Effort:  Pulmonary effort is normal.      Breath sounds: Normal breath sounds.   Musculoskeletal:      Cervical back: Normal range of motion and neck supple.   Lymphadenopathy:      Cervical: No cervical adenopathy.   Skin:     General: Skin is warm and dry.   Neurological:      General: No focal deficit present.      Mental Status: He is alert. Mental status is at baseline.   Psychiatric:         Mood and Affect: Mood normal.         Behavior: Behavior normal.         Judgment: Judgment normal.         Assessment & Plan  Acute otitis externa  Keep ears dry, stop OTC ear drops  Start Cipro-otic 3 gtts bid for 10 days         Otitis media  Start Ceftin 500 mg 1 bid for 10 days  Keep ears dry       Hearing decreased, left  Advise pt.to finish all the meds and will recheck ear and if hearing changes continue will see ENT       Anxiety  Continue with new med dose and Vistaril prn-pt.has scripts             Other orders    cefTRIAXone (ROCEPHIN) 1 g in lidocaine 2.86 mL (tot vol) IM injection    dexAMETHasone 4 mg/mL injection    cefuroxime (CEFTIN) 500 mg Oral Tablet; Take 1 Tablet (500 mg total) by mouth Twice daily          The patient was given the opportunity to ask questions and those questions were answered to the patient's satisfaction. The patient was encouraged to call with any additional questions or concerns.    Follow up: Return in about 2 weeks (around 01/06/2023).    Dewayne Shorter, PA-C

## 2023-01-13 ENCOUNTER — Ambulatory Visit (INDEPENDENT_AMBULATORY_CARE_PROVIDER_SITE_OTHER): Payer: BC Managed Care – PPO | Admitting: PHYSICIAN ASSISTANT

## 2023-01-13 ENCOUNTER — Other Ambulatory Visit: Payer: Self-pay

## 2023-01-13 ENCOUNTER — Encounter (INDEPENDENT_AMBULATORY_CARE_PROVIDER_SITE_OTHER): Payer: Self-pay | Admitting: PHYSICIAN ASSISTANT

## 2023-01-13 VITALS — BP 130/68 | HR 85 | Temp 97.6°F | Ht 75.0 in | Wt >= 6400 oz

## 2023-01-13 DIAGNOSIS — R030 Elevated blood-pressure reading, without diagnosis of hypertension: Secondary | ICD-10-CM

## 2023-01-13 DIAGNOSIS — Z634 Disappearance and death of family member: Secondary | ICD-10-CM

## 2023-01-13 DIAGNOSIS — F419 Anxiety disorder, unspecified: Secondary | ICD-10-CM

## 2023-01-13 DIAGNOSIS — R7303 Prediabetes: Secondary | ICD-10-CM

## 2023-01-13 DIAGNOSIS — L219 Seborrheic dermatitis, unspecified: Secondary | ICD-10-CM

## 2023-01-13 MED ORDER — HYDROXYZINE PAMOATE 25 MG CAPSULE
25.0000 mg | ORAL_CAPSULE | Freq: Three times a day (TID) | ORAL | 0 refills | Status: DC | PRN
Start: 2023-01-13 — End: 2023-03-20

## 2023-01-13 MED ORDER — VENLAFAXINE ER 150 MG CAPSULE,EXTENDED RELEASE 24 HR
150.0000 mg | ORAL_CAPSULE | Freq: Every day | ORAL | 0 refills | Status: DC
Start: 2023-01-13 — End: 2023-03-20

## 2023-01-13 MED ORDER — VENLAFAXINE ER 75 MG CAPSULE,EXTENDED RELEASE 24 HR
75.0000 mg | ORAL_CAPSULE | Freq: Every day | ORAL | 0 refills | Status: DC
Start: 2023-01-13 — End: 2023-03-20

## 2023-01-13 MED ORDER — OMEPRAZOLE 40 MG CAPSULE,DELAYED RELEASE
40.0000 mg | DELAYED_RELEASE_CAPSULE | Freq: Every day | ORAL | 0 refills | Status: DC
Start: 2023-01-13 — End: 2023-08-25

## 2023-01-13 MED ORDER — METHOCARBAMOL 750 MG TABLET
750.0000 mg | ORAL_TABLET | Freq: Two times a day (BID) | ORAL | 1 refills | Status: AC | PRN
Start: 2023-01-13 — End: ?
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
## 2023-01-13 NOTE — Progress Notes (Signed)
FAMILY MEDICINE, MEDICAL OFFICE BUILDING  [address]  [place] [address]      Progress Note    Klint Kaaihue  [birth year]  [id number]    Date of Service: 01/13/2023  4:00 PM EST    Chief complaint:   Chief Complaint   Patient presents with    Follow Up     2 week f/u        Subjective:     This is a case of a 22 y.o. year old male who comes in today for follow up. Pt.feeling better all over, doing better with Effexor and vistaril.       Review of Systems   Constitutional:  Negative for appetite change, chills, fatigue and fever.   HENT: Negative.     Respiratory: Negative.     Cardiovascular: Negative.    Gastrointestinal: Negative.    Psychiatric/Behavioral:          Pt.states feeling better with meds and recently engaged        Current Outpatient Medications   Medication Sig    busPIRone (BUSPAR) 15 mg Oral Tablet Take 1 Tablet (15 mg total) by mouth Three times a day    hydrOXYzine HCL (ATARAX) 10 mg Oral Tablet Take 1 Tablet (10 mg total) by mouth Once per day as needed for Anxiety    hydrOXYzine pamoate (VISTARIL) 25 mg Oral Capsule Take 1 Capsule (25 mg total) by mouth Three times a day as needed for Anxiety for up to 90 days    ketoconazole (NIZORAL) 2 % Shampoo     metFORMIN (GLUCOPHAGE) 500 mg Oral Tablet Take 1 Tablet (500 mg total) by mouth Three times daily with meals    methocarbamoL (ROBAXIN) 750 mg Oral Tablet Take 1 Tablet (750 mg total) by mouth Twice per day as needed    omeprazole (PRILOSEC) 40 mg Oral Capsule, Delayed Release(E.C.) Take 1 Capsule (40 mg total) by mouth Once a day    venlafaxine (EFFEXOR XR) 150 mg Oral Capsule, Sust. Release 24 hr Take 1 Capsule (150 mg total) by mouth Once a day for 30 days (Take along with the venlafaxine 75mg  capsule)    venlafaxine (EFFEXOR XR) 75 mg Oral Capsule, Sust. Release 24 hr Take 1 Capsule (75 mg total) by mouth Once a day for 30 days (Take along with the venlafaxine 150mg  capsule)       Objective:     BP 130/68   Pulse 85   Temp 36.4 C  (97.6 F) (Temporal)   Ht 1.905 m (6\' 3" )   Wt (!) 191 kg (420 lb)   SpO2 97%   BMI 52.50 kg/m         Physical Exam  Vitals and nursing note reviewed.   Constitutional:       General: He is not in acute distress.     Appearance: Normal appearance. He is obese.   HENT:      Head: Normocephalic and atraumatic.      Right Ear: Tympanic membrane normal.      Left Ear: Tympanic membrane normal.      Nose: Nose normal.      Mouth/Throat:      Mouth: Mucous membranes are moist.      Pharynx: Oropharynx is clear.      Comments: Tonsils are chronically enlarged but much decreased last visit  Eyes:      Extraocular Movements: Extraocular movements intact.      Pupils: Pupils are equal, round, and  reactive to light.   Cardiovascular:      Rate and Rhythm: Normal rate and regular rhythm.      Heart sounds: Normal heart sounds.   Pulmonary:      Effort: Pulmonary effort is normal.      Breath sounds: Normal breath sounds.   Abdominal:      General: Abdomen is flat. Bowel sounds are normal.      Palpations: Abdomen is soft.   Musculoskeletal:         General: Normal range of motion.      Cervical back: Normal range of motion and neck supple.   Lymphadenopathy:      Cervical: No cervical adenopathy.   Skin:     General: Skin is warm and dry.   Neurological:      General: No focal deficit present.      Mental Status: He is alert.   Psychiatric:         Behavior: Behavior normal.           Assessment & Plan  Anxiety  Continue the Effexor and vistaril as directed         Pre-diabetes  Review and obtain pts.last labs  Discuss with pt.will review and order labs at next appt.       Seborrheic dermatitis  Continue pt. OTC product he has been using its doing really well with condition       Bereavement due to life event  Continue meds  Consider counseling       Borderline systolic HTN  Continue check BP, pt.states it is most  likely elevated due to being here at office.         Other orders    venlafaxine (EFFEXOR XR) 75 mg Oral  Capsule, Sust. Release 24 hr; Take 1 Capsule (75 mg total) by mouth Once a day for 30 days (Take along with the venlafaxine 150mg  capsule)    venlafaxine (EFFEXOR XR) 150 mg Oral Capsule, Sust. Release 24 hr; Take 1 Capsule (150 mg total) by mouth Once a day for 30 days (Take along with the venlafaxine 75mg  capsule)    hydrOXYzine pamoate (VISTARIL) 25 mg Oral Capsule; Take 1 Capsule (25 mg total) by mouth Three times a day as needed for Anxiety for up to 90 days    omeprazole (PRILOSEC) 40 mg Oral Capsule, Delayed Release(E.C.); Take 1 Capsule (40 mg total) by mouth Once a day    methocarbamoL (ROBAXIN) 750 mg Oral Tablet; Take 1 Tablet (750 mg total) by mouth Twice per day as needed          The patient was given the opportunity to ask questions and those questions were answered to the patient's satisfaction. The patient was encouraged to call with any additional questions or concerns.    Follow up: Return in about 2 months (around 03/16/2023).    Dewayne Shorter, PA-C

## 2023-02-23 ENCOUNTER — Other Ambulatory Visit (INDEPENDENT_AMBULATORY_CARE_PROVIDER_SITE_OTHER): Payer: Self-pay | Admitting: PHYSICIAN ASSISTANT

## 2023-02-23 MED ORDER — METFORMIN 500 MG TABLET
500.0000 mg | ORAL_TABLET | Freq: Three times a day (TID) | ORAL | 1 refills | Status: DC
Start: 2023-02-23 — End: 2023-08-25

## 2023-03-20 ENCOUNTER — Other Ambulatory Visit (INDEPENDENT_AMBULATORY_CARE_PROVIDER_SITE_OTHER): Payer: Self-pay | Admitting: PHYSICIAN ASSISTANT

## 2023-03-22 MED ORDER — HYDROXYZINE PAMOATE 25 MG CAPSULE
25.0000 mg | ORAL_CAPSULE | Freq: Three times a day (TID) | ORAL | 0 refills | Status: DC | PRN
Start: 2023-03-22 — End: 2023-05-19

## 2023-03-22 MED ORDER — VENLAFAXINE ER 75 MG CAPSULE,EXTENDED RELEASE 24 HR
75.0000 mg | ORAL_CAPSULE | Freq: Every day | ORAL | 0 refills | Status: DC
Start: 2023-03-22 — End: 2023-04-24

## 2023-03-22 MED ORDER — VENLAFAXINE ER 150 MG CAPSULE,EXTENDED RELEASE 24 HR
150.0000 mg | ORAL_CAPSULE | Freq: Every day | ORAL | 0 refills | Status: DC
Start: 2023-03-22 — End: 2023-04-24

## 2023-04-24 ENCOUNTER — Other Ambulatory Visit (INDEPENDENT_AMBULATORY_CARE_PROVIDER_SITE_OTHER): Payer: Self-pay | Admitting: PHYSICIAN ASSISTANT

## 2023-04-26 MED ORDER — VENLAFAXINE ER 150 MG CAPSULE,EXTENDED RELEASE 24 HR
150.0000 mg | ORAL_CAPSULE | Freq: Every day | ORAL | 0 refills | Status: DC
Start: 2023-04-26 — End: 2023-06-03

## 2023-04-26 MED ORDER — VENLAFAXINE ER 75 MG CAPSULE,EXTENDED RELEASE 24 HR
75.0000 mg | ORAL_CAPSULE | Freq: Every day | ORAL | 0 refills | Status: DC
Start: 2023-04-26 — End: 2023-06-03

## 2023-05-13 ENCOUNTER — Other Ambulatory Visit (INDEPENDENT_AMBULATORY_CARE_PROVIDER_SITE_OTHER): Payer: Self-pay | Admitting: PHYSICIAN ASSISTANT

## 2023-05-14 ENCOUNTER — Encounter (INDEPENDENT_AMBULATORY_CARE_PROVIDER_SITE_OTHER): Payer: Self-pay | Admitting: PHYSICIAN ASSISTANT

## 2023-05-19 ENCOUNTER — Other Ambulatory Visit (INDEPENDENT_AMBULATORY_CARE_PROVIDER_SITE_OTHER): Payer: Self-pay | Admitting: PHYSICIAN ASSISTANT

## 2023-05-19 MED ORDER — HYDROXYZINE PAMOATE 25 MG CAPSULE
25.0000 mg | ORAL_CAPSULE | Freq: Three times a day (TID) | ORAL | 0 refills | Status: DC | PRN
Start: 2023-05-19 — End: 2023-07-22

## 2023-05-19 MED ORDER — BUSPIRONE 15 MG TABLET
15.0000 mg | ORAL_TABLET | Freq: Three times a day (TID) | ORAL | 0 refills | Status: DC
Start: 2023-05-19 — End: 2023-08-25

## 2023-06-03 ENCOUNTER — Other Ambulatory Visit (INDEPENDENT_AMBULATORY_CARE_PROVIDER_SITE_OTHER): Payer: Self-pay | Admitting: PHYSICIAN ASSISTANT

## 2023-06-03 MED ORDER — VENLAFAXINE ER 75 MG CAPSULE,EXTENDED RELEASE 24 HR
75.0000 mg | ORAL_CAPSULE | Freq: Every day | ORAL | 0 refills | Status: DC
Start: 2023-06-03 — End: 2023-07-09

## 2023-06-03 MED ORDER — VENLAFAXINE ER 150 MG CAPSULE,EXTENDED RELEASE 24 HR
150.0000 mg | ORAL_CAPSULE | Freq: Every day | ORAL | 0 refills | Status: DC
Start: 2023-06-03 — End: 2023-07-22

## 2023-07-09 ENCOUNTER — Other Ambulatory Visit: Payer: Self-pay

## 2023-07-09 ENCOUNTER — Encounter (INDEPENDENT_AMBULATORY_CARE_PROVIDER_SITE_OTHER): Payer: Self-pay | Admitting: PHYSICIAN ASSISTANT

## 2023-07-09 ENCOUNTER — Ambulatory Visit: Payer: Self-pay | Attending: PHYSICIAN ASSISTANT | Admitting: PHYSICIAN ASSISTANT

## 2023-07-09 VITALS — BP 136/65 | HR 77 | Temp 97.7°F | Ht 75.0 in | Wt >= 6400 oz

## 2023-07-09 DIAGNOSIS — K760 Fatty (change of) liver, not elsewhere classified: Secondary | ICD-10-CM | POA: Insufficient documentation

## 2023-07-09 DIAGNOSIS — R519 Headache, unspecified: Secondary | ICD-10-CM | POA: Insufficient documentation

## 2023-07-09 DIAGNOSIS — R7303 Prediabetes: Secondary | ICD-10-CM | POA: Insufficient documentation

## 2023-07-09 DIAGNOSIS — F959 Tic disorder, unspecified: Secondary | ICD-10-CM | POA: Insufficient documentation

## 2023-07-09 DIAGNOSIS — F419 Anxiety disorder, unspecified: Secondary | ICD-10-CM | POA: Insufficient documentation

## 2023-07-09 LAB — HEPATIC FUNCTION PANEL
ALBUMIN/GLOBULIN RATIO: 1.5 — ABNORMAL HIGH (ref 0.8–1.4)
ALBUMIN: 4.7 g/dL (ref 3.5–5.7)
ALKALINE PHOSPHATASE: 70 U/L (ref 34–104)
ALT (SGPT): 101 U/L — ABNORMAL HIGH (ref 7–52)
AST (SGOT): 48 U/L — ABNORMAL HIGH (ref 13–39)
BILIRUBIN DIRECT: 0.13 md/dL (ref 0.03–0.18)
BILIRUBIN TOTAL: 0.5 mg/dL (ref 0.3–1.0)
BILIRUBIN, INDIRECT: 0.37 mg/dL (ref <=1)
GLOBULIN: 3.2 (ref 2.0–3.5)
PROTEIN TOTAL: 7.9 g/dL (ref 6.4–8.9)

## 2023-07-09 LAB — BASIC METABOLIC PANEL
ANION GAP: 8 mmol/L (ref 4–13)
BUN/CREA RATIO: 13 (ref 6–22)
BUN: 12 mg/dL (ref 7–25)
CALCIUM: 9.9 mg/dL (ref 8.6–10.3)
CHLORIDE: 102 mmol/L (ref 98–107)
CO2 TOTAL: 26 mmol/L (ref 21–31)
CREATININE: 0.89 mg/dL (ref 0.60–1.30)
ESTIMATED GFR: 123 mL/min/{1.73_m2} (ref >59)
GLUCOSE: 92 mg/dL (ref 74–109)
OSMOLALITY, CALCULATED: 271 mosm/kg (ref 270–290)
POTASSIUM: 4.5 mmol/L (ref 3.5–5.1)
SODIUM: 136 mmol/L (ref 136–145)

## 2023-07-09 LAB — LIPID PANEL
CHOL/HDL RATIO: 4.4
CHOLESTEROL: 170 mg/dL (ref <200)
HDL CHOL: 39 mg/dL — ABNORMAL LOW (ref >=40)
LDL CALC: 96 mg/dL (ref 0–100)
TRIGLYCERIDES: 176 mg/dL — ABNORMAL HIGH (ref <=150)
VLDL CALC: 35 mg/dL (ref 0–50)

## 2023-07-09 LAB — THYROID STIMULATING HORMONE (SENSITIVE TSH): TSH: 1.144 u[IU]/mL (ref 0.450–5.330)

## 2023-07-09 LAB — HGA1C (HEMOGLOBIN A1C WITH EST AVG GLUCOSE): HEMOGLOBIN A1C: 5.7 % (ref 4.0–6.0)

## 2023-07-09 NOTE — Progress Notes (Signed)
 FAMILY MEDICINE, MEDICAL OFFICE BUILDING  [address]  [place] [address]  Operated by [facility]    Progress Note    Corey Lopez  Jan 22, 2001  [id number]    Date of Service: 07/09/2023  8:30 AM EDT    Chief complaint:   Chief Complaint   Patient presents with    Follow Up     2 month follow up       Subjective:     This is a case of a 23 y.o. year old male who comes in today for follow up. Pt.doing ok. Pt.states he ran out of Effexor  75 mg and d/c med and doing ok without it. Pt.states anxiety controlled. No panic attack. Pt.noticed tic like sx with head and increased with anxiety.       Review of Systems   Constitutional:  Negative for fever.   Respiratory:  Negative for shortness of breath.    Cardiovascular:  Positive for palpitations. Negative for chest pain.   Gastrointestinal: Negative.    Neurological:  Positive for numbness and headaches. Negative for dizziness, tremors, seizures and weakness.        Pt.has noticed a moving  forward head tic and increasing lately with increase stress.    Psychiatric/Behavioral:  Negative for sleep disturbance. The patient is nervous/anxious.       Current Outpatient Medications   Medication Sig    busPIRone  (BUSPAR ) 15 mg Oral Tablet Take 1 Tablet (15 mg total) by mouth Three times a day    hydrOXYzine  HCL (ATARAX ) 10 mg Oral Tablet Take 1 Tablet (10 mg total) by mouth Once per day as needed for Anxiety    hydrOXYzine  pamoate (VISTARIL ) 25 mg Oral Capsule Take 1 Capsule (25 mg total) by mouth Three times a day as needed for Anxiety for up to 90 days    ketoconazole (NIZORAL) 2 % Shampoo     metFORMIN  (GLUCOPHAGE ) 500 mg Oral Tablet Take 1 Tablet (500 mg total) by mouth Three times daily with meals    methocarbamoL  (ROBAXIN ) 750 mg Oral Tablet Take 1 Tablet (750 mg total) by mouth Twice per day as needed    omeprazole  (PRILOSEC) 40 mg Oral Capsule, Delayed Release(E.C.) Take 1 Capsule (40 mg total) by mouth Once a day    venlafaxine  (EFFEXOR  XR) 150  mg Oral Capsule, Sust. Release 24 hr Take 1 Capsule (150 mg total) by mouth Daily for 30 days (Take along with the venlafaxine  75mg  capsule)       Objective:     BP 136/65   Pulse 77   Temp 36.5 C (97.7 F) (Temporal)   Ht 1.905 m (6' 3)   Wt (!) 196 kg (432 lb)   SpO2 96%   BMI 54.00 kg/m       BMI addressed: check labs and consider meds for weight loss too.      Physical Exam  Vitals and nursing note reviewed.   Constitutional:       Appearance: Normal appearance. He is obese.   HENT:      Head: Normocephalic and atraumatic.   Eyes:      Extraocular Movements: Extraocular movements intact.      Conjunctiva/sclera: Conjunctivae normal.   Cardiovascular:      Rate and Rhythm: Normal rate and regular rhythm.      Heart sounds: Normal heart sounds.   Pulmonary:      Effort: Pulmonary effort is normal.      Breath sounds: Normal breath  sounds.   Musculoskeletal:      Cervical back: Normal range of motion.   Neurological:      General: No focal deficit present.      Mental Status: He is alert. Mental status is at baseline.      Motor: No weakness.   Psychiatric:         Mood and Affect: Mood normal.         Behavior: Behavior normal.       Assessment & Plan  Anxiety  Continue meds and refill meds  Orders:    THYROID STIMULATING HORMONE (SENSITIVE TSH); Future    Pre-diabetes  Check labs  Orders:    HGA1C (HEMOGLOBIN A1C WITH EST AVG GLUCOSE); Future    BASIC METABOLIC PANEL; Future    LIPID PANEL; Future    Tic  Check labs  Refer neuro  Orders:    THYROID STIMULATING HORMONE (SENSITIVE TSH); Future    Fatty liver  Check liver u/s last one 2023  Check labs    Orders:    HEPATIC FUNCTION PANEL; Future    US  LIVER; Future    Morning headache  Discuss with patient consider sleep apnea study  Orders:    BASIC METABOLIC PANEL; Future    THYROID STIMULATING HORMONE (SENSITIVE TSH); Future                  The patient was given the opportunity to ask questions and those questions were answered to the patient's  satisfaction. The patient was encouraged to call with any additional questions or concerns.    Follow up: Return in about 6 weeks (around 08/20/2023).    Mills Alma, PA-C
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
## 2023-07-12 ENCOUNTER — Ambulatory Visit (INDEPENDENT_AMBULATORY_CARE_PROVIDER_SITE_OTHER): Payer: Self-pay | Admitting: PHYSICIAN ASSISTANT

## 2023-07-12 DIAGNOSIS — F959 Tic disorder, unspecified: Secondary | ICD-10-CM

## 2023-07-22 ENCOUNTER — Other Ambulatory Visit (INDEPENDENT_AMBULATORY_CARE_PROVIDER_SITE_OTHER): Payer: Self-pay | Admitting: PHYSICIAN ASSISTANT

## 2023-07-23 ENCOUNTER — Other Ambulatory Visit (INDEPENDENT_AMBULATORY_CARE_PROVIDER_SITE_OTHER): Payer: Self-pay | Admitting: PHYSICIAN ASSISTANT

## 2023-07-23 MED ORDER — VENLAFAXINE ER 150 MG CAPSULE,EXTENDED RELEASE 24 HR
150.0000 mg | ORAL_CAPSULE | Freq: Every day | ORAL | 0 refills | Status: DC
Start: 2023-07-23 — End: 2023-07-23

## 2023-07-23 MED ORDER — HYDROXYZINE PAMOATE 25 MG CAPSULE
25.0000 mg | ORAL_CAPSULE | Freq: Three times a day (TID) | ORAL | 0 refills | Status: DC | PRN
Start: 2023-07-23 — End: 2023-08-25

## 2023-07-23 MED ORDER — VENLAFAXINE ER 150 MG CAPSULE,EXTENDED RELEASE 24 HR
150.0000 mg | ORAL_CAPSULE | Freq: Every day | ORAL | 0 refills | Status: DC
Start: 2023-07-23 — End: 2023-08-25

## 2023-08-04 ENCOUNTER — Other Ambulatory Visit: Payer: Self-pay

## 2023-08-04 ENCOUNTER — Ambulatory Visit
Admission: RE | Admit: 2023-08-04 | Discharge: 2023-08-04 | Disposition: A | Discharge status: Home / Self Care | Payer: Self-pay | Source: Ambulatory Visit | Attending: PHYSICIAN ASSISTANT | Admitting: PHYSICIAN ASSISTANT

## 2023-08-04 DIAGNOSIS — K760 Fatty (change of) liver, not elsewhere classified: Secondary | ICD-10-CM | POA: Insufficient documentation

## 2023-08-25 ENCOUNTER — Encounter (INDEPENDENT_AMBULATORY_CARE_PROVIDER_SITE_OTHER): Payer: Self-pay | Admitting: PHYSICIAN ASSISTANT

## 2023-08-25 ENCOUNTER — Other Ambulatory Visit: Payer: Self-pay

## 2023-08-25 ENCOUNTER — Ambulatory Visit: Payer: Self-pay | Attending: PHYSICIAN ASSISTANT | Admitting: PHYSICIAN ASSISTANT

## 2023-08-25 VITALS — BP 160/80 | HR 81 | Temp 98.0°F | Resp 19 | Ht 75.0 in | Wt >= 6400 oz

## 2023-08-25 DIAGNOSIS — I10 Essential (primary) hypertension: Secondary | ICD-10-CM | POA: Insufficient documentation

## 2023-08-25 DIAGNOSIS — R7303 Prediabetes: Secondary | ICD-10-CM | POA: Insufficient documentation

## 2023-08-25 DIAGNOSIS — Z6841 Body Mass Index (BMI) 40.0 and over, adult: Secondary | ICD-10-CM | POA: Insufficient documentation

## 2023-08-25 DIAGNOSIS — F419 Anxiety disorder, unspecified: Secondary | ICD-10-CM | POA: Insufficient documentation

## 2023-08-25 MED ORDER — BUSPIRONE 15 MG TABLET
15.0000 mg | ORAL_TABLET | Freq: Three times a day (TID) | ORAL | 0 refills | Status: AC
Start: 2023-08-25 — End: ?

## 2023-08-25 MED ORDER — VENLAFAXINE ER 150 MG CAPSULE,EXTENDED RELEASE 24 HR: 150.0000 mg | ORAL_CAPSULE | Freq: Every day | ORAL | 0 refills | Status: DC

## 2023-08-25 MED ORDER — OMEPRAZOLE 40 MG CAPSULE,DELAYED RELEASE: 40.0000 mg | DELAYED_RELEASE_CAPSULE | Freq: Every day | ORAL | 0 refills | Status: DC

## 2023-08-25 MED ORDER — ENALAPRIL MALEATE 5 MG TABLET: 5.0000 mg | ORAL_TABLET | Freq: Every day | ORAL | 4 refills | Status: DC

## 2023-08-25 MED ORDER — METFORMIN 500 MG TABLET: 500.0000 mg | ORAL_TABLET | Freq: Three times a day (TID) | ORAL | 1 refills | Status: DC

## 2023-08-25 MED ORDER — HYDROXYZINE PAMOATE 25 MG CAPSULE: 25.0000 mg | ORAL_CAPSULE | Freq: Three times a day (TID) | ORAL | 0 refills | Status: DC | PRN

## 2023-08-25 NOTE — Procedures (Deleted)
 FAMILY MEDICINE, MEDICAL OFFICE BUILDING  [address]  [place] [address]  Operated by [facility]  Procedure Note    Name: Madox Corkins MRN:  [id number]   Date: 08/25/2023 DOB:  [birth year] (23 y.o.)         Procedures    Gerardine Ammon, PA-C
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
## 2023-08-25 NOTE — Progress Notes (Signed)
 FAMILY MEDICINE, MEDICAL OFFICE BUILDING  [address]  [place] [address]  Operated by [facility]    Progress Note    Corey Lopez  July 13, 2000  [id number]    Date of Service: 08/25/2023  8:30 AM EDT    Chief complaint:   Chief Complaint   Patient presents with    Follow Up 6 Weeks     Patient here for follow up on liver ultrasound. Effexor  was decreased to 150 mg daily on previous visit.   Patient tolerating medication change without any adverse effects.        Subjective:     This is a case of a 23 y.o. year old male who comes in today for follow up. Pt.states he is doing ok. No headaches, no dizziness, no supplements. Pt.is getting married 11/13/2023 and has purchased new house. Pt.feels the decrease in Effexor  is still controlling his anxiety. No anxiety attacks or panic attacks. Pt.is considering bariatric, pt.has tried  multiple weight loss programs without long term success over the years.     Review of Systems   Constitutional:  Negative for fatigue and unexpected weight change.   Eyes: Negative.    Respiratory:  Negative for cough, choking, chest tightness, shortness of breath and wheezing.    Cardiovascular:  Negative for chest pain, palpitations and leg swelling.   Gastrointestinal: Negative.    Neurological:  Negative for dizziness, weakness, numbness and headaches.        Current Outpatient Medications   Medication Sig    busPIRone  (BUSPAR ) 15 mg Oral Tablet Take 1 Tablet (15 mg total) by mouth Three times a day    hydrOXYzine  pamoate (VISTARIL ) 25 mg Oral Capsule Take 1 Capsule (25 mg total) by mouth Three times a day as needed for Anxiety for up to 90 days    metFORMIN  (GLUCOPHAGE ) 500 mg Oral Tablet Take 1 Tablet (500 mg total) by mouth Three times daily with meals    methocarbamoL  (ROBAXIN ) 750 mg Oral Tablet Take 1 Tablet (750 mg total) by mouth Twice per day as needed    omeprazole  (PRILOSEC) 40 mg Oral Capsule, Delayed Release(E.C.) Take 1 Capsule (40 mg total) by mouth  Once a day    venlafaxine  (EFFEXOR  XR) 150 mg Oral Capsule, Sust. Release 24 hr Take 1 Capsule (150 mg total) by mouth Daily (Take along with the venlafaxine  75mg  capsule)       Objective:     BP (!) 160/80 (Site: Left Arm, Patient Position: Sitting, Cuff Size: Adult) Comment: manual BP  Pulse 81   Temp 36.7 C (98 F) (Temporal)   Resp 19   Ht 1.905 m (6' 3)   Wt (!) 199 kg (439 lb)   SpO2 96%   BMI 54.87 kg/m       BMI addressed: Advised on diet, weight loss, and exercise to reduce above normal BMI.  Refer to bariatric surgery program.     Physical Exam  Vitals and nursing note reviewed.   Constitutional:       General: He is not in acute distress.     Appearance: Normal appearance. He is obese.   HENT:      Head: Normocephalic and atraumatic.      Nose: Nose normal.   Eyes:      Extraocular Movements: Extraocular movements intact.      Conjunctiva/sclera: Conjunctivae normal.   Cardiovascular:      Rate and Rhythm: Regular rhythm.      Heart sounds:  Normal heart sounds.   Skin:     General: Skin is warm and dry.   Neurological:      General: No focal deficit present.      Mental Status: He is alert. Mental status is at baseline.   Psychiatric:         Mood and Affect: Mood normal.         Thought Content: Thought content normal.         Judgment: Judgment normal.           Assessment & Plan  Morbid obesity with BMI of 50.0-59.9, adult (CMS HCC)  Refer bariatric programs-see referral  Review last labs  Orders:    Referral to External Provider    Anxiety  Continue meds as directed  Refill meds       Pre-diabetes  Review last labs 6/25  Continue meds as directed         HTN (hypertension)  Review labs from recent labs  Discuss BP goals and risk of uncontrolled BP  Start Vasotec  5 mg 1 daily #90/0  RTC next week BP and pulse check only           Other orders    busPIRone  (BUSPAR ) 15 mg Oral Tablet; Take 1 Tablet (15 mg total) by mouth Three times a day    hydrOXYzine  pamoate (VISTARIL ) 25 mg Oral Capsule;  Take 1 Capsule (25 mg total) by mouth Three times a day as needed for Anxiety for up to 90 days    venlafaxine  (EFFEXOR  XR) 150 mg Oral Capsule, Sust. Release 24 hr; Take 1 Capsule (150 mg total) by mouth Daily (Take along with the venlafaxine  75mg  capsule)    metFORMIN  (GLUCOPHAGE ) 500 mg Oral Tablet; Take 1 Tablet (500 mg total) by mouth Three times daily with meals    omeprazole  (PRILOSEC) 40 mg Oral Capsule, Delayed Release(E.C.); Take 1 Capsule (40 mg total) by mouth Daily    enalapril  (VASOTEC ) 5 mg Oral Tablet; Take 1 Tablet (5 mg total) by mouth Daily    The patient was given the opportunity to ask questions and those questions were answered to the patient's satisfaction. The patient was encouraged to call with any additional questions or concerns.    Follow up: Return in about 3 months (around 11/25/2023), or 30 minute and recheck f/u bariatric appt..    Bela Nyborg Nelson-Jones, PA-C

## 2023-09-17 ENCOUNTER — Other Ambulatory Visit: Payer: Self-pay

## 2023-09-17 ENCOUNTER — Encounter (INDEPENDENT_AMBULATORY_CARE_PROVIDER_SITE_OTHER): Payer: Self-pay | Admitting: PHYSICIAN ASSISTANT

## 2023-09-17 ENCOUNTER — Ambulatory Visit: Payer: Self-pay | Attending: PHYSICIAN ASSISTANT | Admitting: PHYSICIAN ASSISTANT

## 2023-09-17 VITALS — BP 139/87 | HR 85 | Temp 97.7°F | Ht 75.0 in | Wt >= 6400 oz

## 2023-09-17 DIAGNOSIS — I10 Essential (primary) hypertension: Secondary | ICD-10-CM | POA: Insufficient documentation

## 2023-09-17 DIAGNOSIS — R7303 Prediabetes: Secondary | ICD-10-CM | POA: Insufficient documentation

## 2023-09-17 DIAGNOSIS — Z6841 Body Mass Index (BMI) 40.0 and over, adult: Secondary | ICD-10-CM | POA: Insufficient documentation
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
## 2023-09-17 NOTE — Progress Notes (Unsigned)
 FAMILY MEDICINE, MEDICAL OFFICE BUILDING  [address]  [place] [address]  Operated by [facility]    Progress Note    Calton Harshfield  2000-03-17  [id number]    Date of Service: 09/17/2023  1:30 PM EDT    Chief complaint:   Chief Complaint   Patient presents with    Weight Loss     Needs papers filled out       Subjective:     This is a case of a 23 y.o. year old Lopez who comes in today for follow up and review and complete part of his bariatric papers.Pt.also has his BP records 137-162/80's. Pt states no headache, no SOB, no CP, no heart flutters. No n,v,d. Pt.states no headaches or dizziness.     Current Outpatient Medications   Medication Sig    busPIRone  (BUSPAR ) 15 mg Oral Tablet Take 1 Tablet (15 mg total) by mouth Three times a day    enalapril  (VASOTEC ) 5 mg Oral Tablet Take 1 Tablet (5 mg total) by mouth Daily    hydrOXYzine  pamoate (VISTARIL ) 25 mg Oral Capsule Take 1 Capsule (25 mg total) by mouth Three times a day as needed for Anxiety for up to 90 days    metFORMIN  (GLUCOPHAGE ) 500 mg Oral Tablet Take 1 Tablet (500 mg total) by mouth Three times daily with meals    methocarbamoL  (ROBAXIN ) 750 mg Oral Tablet Take 1 Tablet (750 mg total) by mouth Twice per day as needed    omeprazole  (PRILOSEC) 40 mg Oral Capsule, Delayed Release(E.C.) Take 1 Capsule (40 mg total) by mouth Daily    venlafaxine  (EFFEXOR  XR) 150 mg Oral Capsule, Sust. Release 24 hr Take 1 Capsule (150 mg total) by mouth Daily (Take along with the venlafaxine  75mg  capsule)       Objective:     BP 139/87   Pulse Corey   Temp 36.5 C (97.7 F) (Temporal)   Ht 1.905 m (6' 3)   Wt (!) 196 kg (432 lb)   SpO2 98%   BMI 54.00 kg/m       BMI addressed: Advised on diet, weight loss, and exercise to reduce above normal BMI.     Physical Exam  Vitals and nursing note reviewed.   Constitutional:       Appearance: Normal appearance.   Cardiovascular:      Rate and Rhythm: Normal rate and regular rhythm.      Heart sounds:  Normal heart sounds.   Neurological:      Mental Status: He is alert.        Assessment & Plan                  The patient was given the opportunity to ask questions and those questions were answered to the patient's satisfaction. The patient was encouraged to call with any additional questions or concerns.    Follow up: Return keep follow up 11/26/23.    Gerardine Ammon, PA-C

## 2023-09-30 ENCOUNTER — Ambulatory Visit (HOSPITAL_COMMUNITY)

## 2023-09-30 ENCOUNTER — Ambulatory Visit (HOSPITAL_BASED_OUTPATIENT_CLINIC_OR_DEPARTMENT_OTHER)
Admission: RE | Admit: 2023-09-30 | Discharge: 2023-09-30 | Disposition: A | Discharge status: Home / Self Care | Source: Ambulatory Visit

## 2023-09-30 ENCOUNTER — Other Ambulatory Visit (HOSPITAL_COMMUNITY): Payer: Self-pay

## 2023-09-30 ENCOUNTER — Other Ambulatory Visit: Payer: Self-pay

## 2023-09-30 DIAGNOSIS — I10 Essential (primary) hypertension: Secondary | ICD-10-CM

## 2023-09-30 DIAGNOSIS — R7303 Prediabetes: Secondary | ICD-10-CM

## 2023-09-30 DIAGNOSIS — K219 Gastro-esophageal reflux disease without esophagitis: Secondary | ICD-10-CM

## 2023-09-30 DIAGNOSIS — F419 Anxiety disorder, unspecified: Secondary | ICD-10-CM

## 2023-09-30 DIAGNOSIS — I440 Atrioventricular block, first degree: Secondary | ICD-10-CM

## 2023-09-30 DIAGNOSIS — E66811 Obesity, class 1: Secondary | ICD-10-CM

## 2023-09-30 DIAGNOSIS — Z23 Encounter for immunization: Secondary | ICD-10-CM | POA: Insufficient documentation

## 2023-09-30 DIAGNOSIS — R9431 Abnormal electrocardiogram [ECG] [EKG]: Secondary | ICD-10-CM

## 2023-09-30 LAB — IRON TRANSFERRIN AND TIBC
IRON (TRANSFERRIN) SATURATION: 21 % (ref 20–50)
IRON: 72 ug/dL (ref 50–212)
TOTAL IRON BINDING CAPACITY: 346 ug/dL (ref 250–450)
TRANSFERRIN: 247 mg/dL (ref 203–362)
UIBC: 274 ug/dL (ref 130–375)

## 2023-09-30 LAB — CBC
HCT: 42.2 % (ref 36.7–47.1)
HGB: 14.4 g/dL (ref 12.5–16.3)
MCH: 28.3 pg (ref 23.8–33.4)
MCHC: 34.2 g/dL (ref 32.5–36.3)
MCV: 82.7 fL (ref 73.0–96.2)
MPV: 9.6 fL (ref 7.4–11.4)
PLATELETS: 277 x10ˆ3/uL (ref 140–440)
RBC: 5.1 x10ˆ6/uL (ref 4.06–5.63)
RDW: 13 % (ref 12.1–16.2)
WBC: 6.6 x10ˆ3/uL (ref 3.6–10.2)

## 2023-09-30 LAB — ECG 12 LEAD
Atrial Rate: 70 {beats}/min
Calculated P Axis: 35 degrees
Calculated R Axis: -14 degrees
Calculated T Axis: 29 degrees
PR Interval: 248 ms
QRS Duration: 106 ms
QT Interval: 366 ms
QTC Calculation: 395 ms
Ventricular rate: 70 {beats}/min

## 2023-09-30 LAB — VITAMIN B12: VITAMIN B 12: 423 pg/mL (ref 180–914)

## 2023-09-30 LAB — FOLATE: FOLATE: 15.3 ng/mL (ref 5.9–24.8)

## 2023-09-30 LAB — PARATHYROID HORMONE (PTH): PTH: 36.6 pg/mL (ref 12.0–88.0)

## 2023-09-30 LAB — VITAMIN D 25 TOTAL: VITAMIN D 25, TOTAL: 19.58 ng/mL — ABNORMAL LOW (ref 30.00–100.00)

## 2023-09-30 LAB — THYROXINE, TOTAL T4: T4 TOTAL: 8.3 ug/dL (ref 6.1–12.2)

## 2023-10-03 LAB — NICOTINE AND METABOLITES, URINE
3 OH COTININE, URINE: <2 ng/mL
ANABASINE: <2 ng/mL
COTININE, URINE: <2 ng/mL
NICOTINE, URINE: <2 ng/mL
NOR COTININE: <2 ng/mL
NOR NICOTINE: <2 ng/mL

## 2023-10-10 LAB — VITAMIN B1 (THIAMIN), WHOLE BLOOD: VITAMIN B1 (THIAMINE), BLOOD, LC/MS/MS: 129 nmol/L (ref 78–185)

## 2023-10-24 ENCOUNTER — Other Ambulatory Visit (INDEPENDENT_AMBULATORY_CARE_PROVIDER_SITE_OTHER): Payer: Self-pay | Admitting: PHYSICIAN ASSISTANT

## 2023-11-19 ENCOUNTER — Telehealth (INDEPENDENT_AMBULATORY_CARE_PROVIDER_SITE_OTHER): Payer: Self-pay | Admitting: PHYSICIAN ASSISTANT

## 2023-11-19 MED ORDER — AZITHROMYCIN 250 MG TABLET
ORAL_TABLET | ORAL | 0 refills | Status: DC
Start: 2023-11-19 — End: 2023-11-26

## 2023-11-19 NOTE — Telephone Encounter (Signed)
Spoke with pt. Voiced understanding.

## 2023-11-19 NOTE — Telephone Encounter (Signed)
 Received call from pt. Stating that his throat is sore and he has a cough, it started Tuesday, no fever, he thinks it might be strep throat and is asking for an antibiotic to be sent to [facility]

## 2023-11-26 ENCOUNTER — Encounter (INDEPENDENT_AMBULATORY_CARE_PROVIDER_SITE_OTHER): Payer: Self-pay | Admitting: PHYSICIAN ASSISTANT

## 2023-11-26 ENCOUNTER — Other Ambulatory Visit: Payer: Self-pay

## 2023-11-26 ENCOUNTER — Ambulatory Visit: Payer: Self-pay | Admitting: PHYSICIAN ASSISTANT

## 2023-11-26 VITALS — BP 125/82 | HR 88 | Temp 97.8°F | Ht 75.0 in | Wt >= 6400 oz

## 2023-11-26 DIAGNOSIS — R7303 Prediabetes: Secondary | ICD-10-CM | POA: Insufficient documentation

## 2023-11-26 DIAGNOSIS — Z23 Encounter for immunization: Secondary | ICD-10-CM | POA: Insufficient documentation

## 2023-11-26 DIAGNOSIS — I10 Essential (primary) hypertension: Secondary | ICD-10-CM | POA: Insufficient documentation

## 2023-11-26 DIAGNOSIS — F419 Anxiety disorder, unspecified: Secondary | ICD-10-CM | POA: Insufficient documentation

## 2023-11-26 NOTE — Progress Notes (Signed)
 FAMILY MEDICINE, MEDICAL OFFICE BUILDING  [address]  [place] [address]  Operated by [facility]    Progress Note    Corey Lopez  02/11/2000  [id number]    Date of Service: 11/26/2023  4:45 PM EDT    Chief complaint:   Chief Complaint   Patient presents with    Follow Up 3 Months     Pt here to f/u on strep throat from last week.       Subjective:     This is a case of a 23 y.o. year old male who comes in today for follow up. Pt.states he got married 11/13/2023. Pt.states he got strep/sick on honeymoon. Pt.states he is feeling better regards to strep and being sick. Pt.states he is awaiting the bariatric surgery. Pt.voices no concerns. Pt.states no anxiety and panic attacks.    Review of Systems   Constitutional: Negative.    HENT: Negative.     Respiratory: Negative.     Cardiovascular: Negative.    Gastrointestinal: Negative.    Musculoskeletal:  Positive for back pain.        History of lumbar back pain with occ. Spasms and uses robaxin  prn      Current Outpatient Medications   Medication Sig    busPIRone  (BUSPAR ) 15 mg Oral Tablet Take 1 Tablet (15 mg total) by mouth Three times a day    enalapril  (VASOTEC ) 5 mg Oral Tablet Take 1 Tablet (5 mg total) by mouth Daily    ergocalciferol , vitamin D2, (DRISDOL) 1,250 mcg (50,000 unit) Oral Capsule Take 1 Capsule (50,000 Units total) by mouth Every 7 days    hydrOXYzine  pamoate (VISTARIL ) 25 mg Oral Capsule Take 1 Capsule (25 mg total) by mouth Three times a day as needed for Anxiety for up to 90 days    metFORMIN  (GLUCOPHAGE ) 500 mg Oral Tablet Take 1 Tablet (500 mg total) by mouth Three times daily with meals    methocarbamoL  (ROBAXIN ) 750 mg Oral Tablet Take 1 Tablet (750 mg total) by mouth Twice per day as needed    omeprazole  (PRILOSEC) 40 mg Oral Capsule, Delayed Release(E.C.) Take 1 Capsule (40 mg total) by mouth Daily    venlafaxine  (EFFEXOR  XR) 150 mg Oral Capsule, Sust. Release 24 hr Take 1 Capsule (150 mg total) by mouth Daily  (Take along with the venlafaxine  75mg  capsule)       Objective:     BP 125/82   Pulse 88   Temp 36.6 C (97.8 F) (Temporal)   Ht 1.905 m (6' 3)   Wt (!) 190 kg (418 lb)   SpO2 97%   BMI 52.25 kg/m       BMI addressed: Refer to bariatric surgery program.pt.follows with bariatric program and awaiting surgery date.     Physical Exam  Vitals and nursing note reviewed.   Constitutional:       Appearance: Normal appearance.   HENT:      Head: Normocephalic and atraumatic.      Right Ear: Tympanic membrane normal.      Nose: Nose normal.      Mouth/Throat:      Mouth: Mucous membranes are moist.      Pharynx: Oropharynx is clear.   Eyes:      Extraocular Movements: Extraocular movements intact.      Pupils: Pupils are equal, round, and reactive to light.   Neck:      Vascular: No carotid bruit.   Cardiovascular:  Rate and Rhythm: Normal rate and regular rhythm.      Heart sounds: Normal heart sounds.   Pulmonary:      Effort: Pulmonary effort is normal.      Breath sounds: Normal breath sounds.   Musculoskeletal:      Cervical back: Normal range of motion and neck supple.   Lymphadenopathy:      Cervical: No cervical adenopathy.   Skin:     General: Skin is warm and dry.   Neurological:      General: No focal deficit present.      Mental Status: He is alert.   Psychiatric:         Mood and Affect: Mood normal.         Behavior: Behavior normal.           Assessment & Plan  HTN (hypertension)  Continue meds   Pt.call when needs refills  Monitor BP at home       Pre-diabetes  Obtain recent pre-op labs  Continue with bariatric program       Anxiety  Continue Buspar  as directed          Other orders    Flu Vaccine,6 month-adult,0.5 mL IM (Admin)          The patient was given the opportunity to ask questions and those questions were answered to the patient's satisfaction. The patient was encouraged to call with any additional questions or concerns.    Follow up: Return in about 3 months (around 02/26/2024), or 30  minute.    Gerardine Ammon, PA-C

## 2023-11-26 NOTE — Nursing Note (Signed)
 11/26/23 1641   Depression Screen   Little interest or pleasure in doing things. 0   Feeling down, depressed, or hopeless 0   PHQ 2 Total 0
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
## 2023-12-06 ENCOUNTER — Other Ambulatory Visit (INDEPENDENT_AMBULATORY_CARE_PROVIDER_SITE_OTHER): Payer: Self-pay | Admitting: PHYSICIAN ASSISTANT

## 2023-12-06 MED ORDER — AZITHROMYCIN 500 MG TABLET: 500.0000 mg | ORAL_TABLET | Freq: Every day | ORAL | 0 refills | Status: DC

## 2023-12-06 NOTE — Telephone Encounter (Signed)
 Pt.calls with c/o recurrent sore throat like with his strep cases  Zithromax  500 mg 1 daily #3/0-sent to pharmacy  Replace toothbrush  Consider ENT appt.Corey Lopez

## 2023-12-08 ENCOUNTER — Other Ambulatory Visit: Payer: Self-pay

## 2023-12-08 ENCOUNTER — Ambulatory Visit: Payer: Self-pay | Attending: PHYSICIAN ASSISTANT | Admitting: PHYSICIAN ASSISTANT

## 2023-12-08 ENCOUNTER — Ambulatory Visit (HOSPITAL_COMMUNITY)

## 2023-12-08 ENCOUNTER — Encounter (INDEPENDENT_AMBULATORY_CARE_PROVIDER_SITE_OTHER): Payer: Self-pay | Admitting: PHYSICIAN ASSISTANT

## 2023-12-08 VITALS — BP 136/78 | HR 88 | Temp 98.0°F | Ht 75.0 in | Wt >= 6400 oz

## 2023-12-08 DIAGNOSIS — J029 Acute pharyngitis, unspecified: Secondary | ICD-10-CM | POA: Insufficient documentation

## 2023-12-08 DIAGNOSIS — J039 Acute tonsillitis, unspecified: Secondary | ICD-10-CM | POA: Insufficient documentation

## 2023-12-08 DIAGNOSIS — R5383 Other fatigue: Secondary | ICD-10-CM

## 2023-12-08 LAB — CBC WITH DIFF
BASOPHIL #: 0.1 x10ˆ3/uL (ref 0.00–0.10)
BASOPHIL %: 1 % (ref 0–1)
EOSINOPHIL #: 0.4 x10ˆ3/uL (ref 0.00–0.60)
EOSINOPHIL %: 5 % (ref 1–8)
HCT: 42.4 % (ref 36.7–47.1)
HGB: 14.4 g/dL (ref 12.5–16.3)
LYMPHOCYTE #: 3.1 x10ˆ3/uL — ABNORMAL HIGH (ref 1.00–3.00)
LYMPHOCYTE %: 38 % (ref 15–43)
MCH: 27.9 pg (ref 23.8–33.4)
MCHC: 33.9 g/dL (ref 32.5–36.3)
MCV: 82.4 fL (ref 73.0–96.2)
MONOCYTE #: 0.6 x10ˆ3/uL (ref 0.30–1.10)
MONOCYTE %: 8 % (ref 6–14)
MPV: 10.1 fL (ref 7.4–11.4)
NEUTROPHIL #: 4.1 x10ˆ3/uL (ref 1.85–7.84)
NEUTROPHIL %: 49 % (ref 44–74)
PLATELETS: 316 x10ˆ3/uL (ref 140–440)
RBC: 5.15 x10ˆ6/uL (ref 4.06–5.63)
RDW: 13.2 % (ref 12.1–16.2)
WBC: 8.2 x10ˆ3/uL (ref 3.6–10.2)

## 2023-12-08 LAB — MONO TEST: MONONUCLEOSIS RAPID TEST: NEGATIVE

## 2023-12-08 MED ORDER — DEXAMETHASONE SODIUM PHOSPHATE 4 MG/ML INJECTION SOLUTION
4.0000 mg | INTRAMUSCULAR | Status: AC
Start: 2023-12-08 — End: 2023-12-08
  Administered 2023-12-08: 4 mg via INTRAMUSCULAR

## 2023-12-08 MED ORDER — AMOXICILLIN 875 MG-POTASSIUM CLAVULANATE 125 MG TABLET: 1.0000 | ORAL_TABLET | Freq: Two times a day (BID) | ORAL | 0 refills | Status: DC

## 2023-12-08 MED ORDER — LIDOCAINE HCL 10 MG/ML (1 %) INJECTION SOLUTION
1.0000 g | INTRAMUSCULAR | Status: AC
Start: 2023-12-08 — End: 2023-12-08
  Administered 2023-12-08: 1 g via INTRAMUSCULAR

## 2023-12-08 NOTE — Progress Notes (Signed)
 FAMILY MEDICINE, MEDICAL OFFICE BUILDING  [address]  [place] [address]  Operated by [facility]    Progress Note    Corey Lopez  September 07, 2000  [id number]    Date of Service: 12/08/2023  4:00 PM EST    Chief complaint:   Chief Complaint   Patient presents with    Strep Throat     Pt c/o sore throat since Sunday along with headaches.       Subjective:     This is a case of a 23 y.o. year old male who comes in today for acute visit. Pt.c/o recurrent sorethroat, and headaches and he is finishing Zithromax, pain with swallowing and eating. Fever,chills, frontal headaches, post-nasal d/c, pt.had strep 2-3 weeks ago. Naproxen with relief of headache, no ear pain, no sob, no cough,. Pt.tired at times, he has taken naps with is out of character.    Review of Systems   All systems reviewed and negative except for what is noted above.         Current Outpatient Medications   Medication Sig    azithromycin (ZITHROMAX) 500 mg Oral Tablet Take 1 Tablet (500 mg total) by mouth Daily    busPIRone  (BUSPAR ) 15 mg Oral Tablet Take 1 Tablet (15 mg total) by mouth Three times a day    enalapril  (VASOTEC ) 5 mg Oral Tablet Take 1 Tablet (5 mg total) by mouth Daily    ergocalciferol , vitamin D2, (DRISDOL) 1,250 mcg (50,000 unit) Oral Capsule Take 1 Capsule (50,000 Units total) by mouth Every 7 days    hydrOXYzine  pamoate (VISTARIL ) 25 mg Oral Capsule Take 1 Capsule (25 mg total) by mouth Three times a day as needed for Anxiety for up to 90 days    metFORMIN  (GLUCOPHAGE ) 500 mg Oral Tablet Take 1 Tablet (500 mg total) by mouth Three times daily with meals    methocarbamoL  (ROBAXIN ) 750 mg Oral Tablet Take 1 Tablet (750 mg total) by mouth Twice per day as needed    omeprazole  (PRILOSEC) 40 mg Oral Capsule, Delayed Release(E.C.) Take 1 Capsule (40 mg total) by mouth Daily    venlafaxine  (EFFEXOR  XR) 150 mg Oral Capsule, Sust. Release 24 hr Take 1 Capsule (150 mg total) by mouth Daily (Take along with the venlafaxine   75mg  capsule)       Objective:     BP 136/78   Pulse 88   Temp 36.7 C (98 F) (Temporal)   Ht 1.905 m (6' 3)   Wt (!) 191 kg (420 lb)   SpO2 97%   BMI 52.50 kg/m     Physical Exam  Vitals and nursing note reviewed.   Constitutional:       Appearance: Normal appearance.   HENT:      Head: Normocephalic and atraumatic.      Right Ear: Tympanic membrane normal.      Left Ear: Tympanic membrane normal.      Nose: Congestion present.      Mouth/Throat:      Mouth: Mucous membranes are moist.      Pharynx: Oropharynx is clear. Posterior oropharyngeal erythema present.      Comments: Throat injected with enlarged turbinates right greater than left and uvula with edema  Eyes:      Extraocular Movements: Extraocular movements intact.      Pupils: Pupils are equal, round, and reactive to light.   Neck:      Vascular: No carotid bruit.   Cardiovascular:  Rate and Rhythm: Normal rate and regular rhythm.      Heart sounds: Normal heart sounds.   Pulmonary:      Effort: Pulmonary effort is normal.      Breath sounds: Normal breath sounds.   Musculoskeletal:         General: Normal range of motion.      Cervical back: Normal range of motion and neck supple.   Skin:     General: Skin is warm and dry.   Neurological:      General: No focal deficit present.      Mental Status: He is alert.   Psychiatric:         Mood and Affect: Mood normal.         Behavior: Behavior normal.        Assessment & Plan  Pharyngitis    Orders:    CBC/DIFF; Future    EBV ANTIBODY PROFILE; Future    MONO TEST; Future    THROAT CULTURE, BETA HEMOLYTIC STREPTOCOCCUS; Future    Tonsillitis    Orders:    CBC/DIFF; Future    EBV ANTIBODY PROFILE; Future    MONO TEST; Future    THROAT CULTURE, BETA HEMOLYTIC STREPTOCOCCUS; Future    Fatigue, unspecified type    Orders:    CBC/DIFF; Future    EBV ANTIBODY PROFILE; Future    MONO TEST; Future    THROAT CULTURE, BETA HEMOLYTIC STREPTOCOCCUS; Future  consider ENT after labs reviewed     Other orders     cefTRIAXone  (ROCEPHIN ) 1 g in lidocaine  2.86 mL (tot vol) IM injection    dexAMETHasone  4 mg/mL injection    amoxicillin -pot clavulanate (AUGMENTIN) 875-125 mg Oral Tablet; Take 1 Tablet by mouth Twice daily          The patient was given the opportunity to ask questions and those questions were answered to the patient's satisfaction. The patient was encouraged to call with any additional questions or concerns.    Follow up: Return in about 2 weeks (around 12/22/2023).    Gerardine Ammon, PA-C
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
## 2023-12-10 ENCOUNTER — Ambulatory Visit (INDEPENDENT_AMBULATORY_CARE_PROVIDER_SITE_OTHER): Payer: Self-pay | Admitting: PHYSICIAN ASSISTANT

## 2023-12-10 ENCOUNTER — Other Ambulatory Visit (INDEPENDENT_AMBULATORY_CARE_PROVIDER_SITE_OTHER): Payer: Self-pay | Admitting: PHYSICIAN ASSISTANT

## 2023-12-10 DIAGNOSIS — J029 Acute pharyngitis, unspecified: Secondary | ICD-10-CM

## 2023-12-10 DIAGNOSIS — R1310 Dysphagia, unspecified: Secondary | ICD-10-CM

## 2023-12-10 LAB — EBV ANTIBODY PROFILE
EBNA ANTIBODY, QUALITATIVE: POSITIVE — AB
EBV VCA IGG ANTIBODY, QUALITATIVE: POSITIVE — AB
EBV VCA IGM ANTIBODY, QUALITATIVE: NEGATIVE

## 2023-12-10 LAB — THROAT CULTURE, BETA HEMOLYTIC STREPTOCOCCUS: THROAT CULTURE: NORMAL

## 2023-12-20 ENCOUNTER — Other Ambulatory Visit: Payer: Self-pay

## 2023-12-20 ENCOUNTER — Ambulatory Visit: Payer: Self-pay | Attending: Family Medicine | Admitting: PHYSICIAN ASSISTANT

## 2023-12-20 ENCOUNTER — Encounter (INDEPENDENT_AMBULATORY_CARE_PROVIDER_SITE_OTHER): Payer: Self-pay | Admitting: PHYSICIAN ASSISTANT

## 2023-12-20 VITALS — BP 122/86 | HR 78 | Temp 96.7°F | Resp 18 | Ht 75.0 in

## 2023-12-20 DIAGNOSIS — F419 Anxiety disorder, unspecified: Secondary | ICD-10-CM | POA: Insufficient documentation

## 2023-12-20 DIAGNOSIS — I10 Essential (primary) hypertension: Secondary | ICD-10-CM | POA: Insufficient documentation

## 2023-12-20 DIAGNOSIS — R7303 Prediabetes: Secondary | ICD-10-CM | POA: Insufficient documentation
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
## 2023-12-20 NOTE — Progress Notes (Signed)
 FAMILY MEDICINE, MEDICAL OFFICE BUILDING  [address]  [place] [address]  Operated by [facility]    Progress Note    Corey Lopez  2000/07/11  [id number]    Date of Service: 12/20/2023 10:00 AM EST    Chief complaint:   Chief Complaint   Patient presents with    Follow Up 2 Weeks       Subjective:     This is a case of a 23 y.o. year old male who comes in today for follow up. Pt.states he is feeling better. Pt.states no sore throat, no headaches, no cough. No fever or chills . Pt.is scheduled bariatric surgery 01/14/2024 with Dr.Mooney in [place].  Pt.scheduled ENT to evaluate 12/29/2023. No headaches, no dizziness. Handling all activities well.       Review of Systems   All systems reviewed and negative except for what is noted above.         Current Outpatient Medications   Medication Sig    amoxicillin -pot clavulanate (AUGMENTIN ) 875-125 mg Oral Tablet Take 1 Tablet by mouth Twice daily    azithromycin  (ZITHROMAX ) 500 mg Oral Tablet Take 1 Tablet (500 mg total) by mouth Daily (Patient not taking: Reported on 12/20/2023)    busPIRone  (BUSPAR ) 15 mg Oral Tablet Take 1 Tablet (15 mg total) by mouth Three times a day    enalapril  (VASOTEC ) 5 mg Oral Tablet Take 1 Tablet (5 mg total) by mouth Daily    ergocalciferol , vitamin D2, (DRISDOL) 1,250 mcg (50,000 unit) Oral Capsule Take 1 Capsule (50,000 Units total) by mouth Every 7 days    hydrOXYzine  pamoate (VISTARIL ) 25 mg Oral Capsule Take 1 Capsule (25 mg total) by mouth Three times a day as needed for Anxiety for up to 90 days    metFORMIN  (GLUCOPHAGE ) 500 mg Oral Tablet Take 1 Tablet (500 mg total) by mouth Three times daily with meals    methocarbamoL  (ROBAXIN ) 750 mg Oral Tablet Take 1 Tablet (750 mg total) by mouth Twice per day as needed    omeprazole  (PRILOSEC) 40 mg Oral Capsule, Delayed Release(E.C.) Take 1 Capsule (40 mg total) by mouth Daily    venlafaxine  (EFFEXOR  XR) 150 mg Oral Capsule, Sust. Release 24 hr Take 1 Capsule (150  mg total) by mouth Daily (Take along with the venlafaxine  75mg  capsule)       Objective:     BP 122/86 (Site: Right Arm, Patient Position: Sitting, Cuff Size: Adult)   Pulse 78   Temp (!) 35.9 C (96.7 F) (Tympanic)   Resp 18   Ht 1.905 m (6' 3)   SpO2 96%   BMI 52.50 kg/m     Physical Exam  Vitals and nursing note reviewed.   Constitutional:       Appearance: Normal appearance.   HENT:      Head: Normocephalic and atraumatic.      Right Ear: Tympanic membrane normal.      Left Ear: Tympanic membrane normal.      Mouth/Throat:      Pharynx: Posterior oropharyngeal erythema present.   Eyes:      Extraocular Movements: Extraocular movements intact.      Conjunctiva/sclera: Conjunctivae normal.   Cardiovascular:      Rate and Rhythm: Normal rate and regular rhythm.   Pulmonary:      Effort: Pulmonary effort is normal.      Breath sounds: Normal breath sounds.   Musculoskeletal:      Cervical back: Normal range  of motion.   Neurological:      General: No focal deficit present.      Mental Status: He is alert. Mental status is at baseline.   Psychiatric:         Mood and Affect: Mood normal.         Judgment: Judgment normal.           Assessment & Plan  HTN (hypertension)  Continue meds as directed       Pre-diabetes  Continue with bariatric surgery       Anxiety                       The patient was given the opportunity to ask questions and those questions were answered to the patient's satisfaction. The patient was encouraged to call with any additional questions or concerns.    Follow up: No follow-ups on file.    Gerardine Ammon, PA-C

## 2023-12-29 ENCOUNTER — Other Ambulatory Visit: Payer: Self-pay

## 2023-12-29 ENCOUNTER — Encounter (INDEPENDENT_AMBULATORY_CARE_PROVIDER_SITE_OTHER): Payer: Self-pay | Admitting: NURSE PRACTITIONER

## 2023-12-29 ENCOUNTER — Ambulatory Visit: Payer: Self-pay | Admitting: NURSE PRACTITIONER

## 2023-12-29 VITALS — Ht 75.0 in | Wt >= 6400 oz

## 2023-12-29 DIAGNOSIS — J353 Hypertrophy of tonsils with hypertrophy of adenoids: Secondary | ICD-10-CM

## 2023-12-29 DIAGNOSIS — R1310 Dysphagia, unspecified: Secondary | ICD-10-CM

## 2023-12-29 DIAGNOSIS — J3501 Chronic tonsillitis: Secondary | ICD-10-CM

## 2023-12-29 NOTE — Procedures (Signed)
 [facility]  [address]  [place] [address]  Operated by [facility]  Procedure Note    Name: Corey Lopez MRN:  [id number]   Date: 12/29/2023 DOB:  04/28/2000 (23 y.o.)         31575 - LARYNGOSCOPY, FLEXIBLE DIAGNOSTIC (AMB ONLY)    Performed by: Ira Setter, APRN, CNP  Authorized by: Ira Setter, APRN, CNP    Time Out:     Immediately before the procedure, a time out was called:  Yes    Patient verified:  Yes    Procedure Verified:  Yes    Site Verified:  Yes  Documentation:      [facility]  [address]  [place] [address]  Operated by [facility]  Procedure Note    Name: Corey Lopez MRN:  [id number]  Date: 12/29/2023 DOB:  Nov 08, 2000 (23 y.o.)        @PROCDOC @    Indications for procedure: Unable to perform indirect laryngoscopy secondary to gag reflex, Unable to fully visualize laryngeal anatomy on indirect laryngoscopy, and Obstructive sleep apnea / Snoring    Anesthesia: Oxymetazoline nasal spray    Description: The flexible endoscope was gently introduced into the nostril and passed along the floor of the nose to the nasopharynx. Adenoid 2+ and eustachian tubes normal. The retropalatal airway was patent.    The endoscope was passed to the oropharynx. Base of tongue displayed normal lingual tonsils, patent valelulla, and sharply defined upright epiglottis. Retrolingual airway was patent.    The larynx displayed normal true vocal cords with good mobility. False cords were normal. Arytenoid mucosa was pink with no edema.     The piriform recesses were symmetric without secretion. The hypopharynx was symmetric without lesion.    Findings: Symmetrical true vocal fold motion and Adenoid hypertrophy    The patient tolerated the procedure well.    Setter Ira, APRN, CNP                 Arilynn Blakeney, APRN, CNP

## 2023-12-29 NOTE — H&P (Signed)
 [facility]  [address]  [place] [address]  Phone: [phone]  Fax: [phone]      Encounter Date: 12/29/2023    Patient ID: Corey Lopez  MRN: [id number]    DOB: Sep 29, 2000  Age: 23 y.o. male         Referring Provider:    Onetha Hudson, PA-C  [address]  [place],  [place] [address]    Reason for Visit:   Chief Complaint   Patient presents with    Strep Throat     Patient complains of recurrent strep and tonsil stones. Pt states strep 5 times in the last year. Pt does snore        History of Present Illness:  Corey Lopez is a 23 y.o. male referred for recurrent strep throat.  He reports recurrent strep throat since early childhood.  He recently was treated by PCP for strep in October and 2 antibiotics in November.  He is having bariatric surgery later this month.      Patient History:  Problem List[1]  Current Outpatient Medications   Medication Sig    busPIRone  (BUSPAR ) 15 mg Oral Tablet Take 1 Tablet (15 mg total) by mouth Three times a day    enalapril  (VASOTEC ) 5 mg Oral Tablet Take 1 Tablet (5 mg total) by mouth Daily    ergocalciferol , vitamin D2, (DRISDOL) 1,250 mcg (50,000 unit) Oral Capsule Take 1 Capsule (50,000 Units total) by mouth Every 7 days    hydrOXYzine  pamoate (VISTARIL ) 25 mg Oral Capsule Take 1 Capsule (25 mg total) by mouth Three times a day as needed for Anxiety for up to 90 days    metFORMIN  (GLUCOPHAGE ) 500 mg Oral Tablet Take 1 Tablet (500 mg total) by mouth Three times daily with meals    methocarbamoL  (ROBAXIN ) 750 mg Oral Tablet Take 1 Tablet (750 mg total) by mouth Twice per day as needed    omeprazole  (PRILOSEC) 40 mg Oral Capsule, Delayed Release(E.C.) Take 1 Capsule (40 mg total) by mouth Daily    venlafaxine  (EFFEXOR  XR) 150 mg Oral Capsule, Sust. Release 24 hr Take 1 Capsule (150 mg total) by mouth Daily (Take along with the venlafaxine  75mg  capsule)     Allergies[2]  Past Medical History:   Diagnosis Date    Acanthosis nigricans     Anxiety     DDD  (degenerative disc disease), lumbar     Fatty liver     High insulin level     Hypertension     Prediabetes     Premature Birth     1 month early      Past Surgical History:   Procedure Laterality Date    TEAR DUCT SURGERY      As an infant with Dr.Blaydes      Family Medical History:       Problem Relation (Age of Onset)    Cirrhosis Paternal Grandfather    Diabetes type II Mother, Father    Fainting Father    Goiter Mother    Healthy Brother    Hypothyroidism Mother    Liver Cancer Father    Other cancer Maternal Grandmother    Thyroid  Cancer Mother    Thyroid  Disease Maternal Grandmother            Social History[3]    Review of Systems     Vitals:    12/29/23 1459   Weight: (!) 191 kg (420 lb)   Height: 1.905 m (6' 3)  BMI: 52.5      ENT Physical Exam  Constitutional  Appearance: patient appears well-developed and well-groomed, obesity noted,  Communication/Voice: communication appropriate for developmental age; vocal quality normal;  Head and Face  Appearance: head appears normal, face appears normal and face appears atraumatic;  Palpation: facial palpation normal;  Salivary: glands normal;  Ear  Hearing: intact;  Auricles: right auricle normal; left auricle normal;  External Mastoids: right external mastoid normal; left external mastoid normal;  Ear Canals: right ear canal normal; left ear canal normal;  Tympanic Membranes: right tympanic membrane normal; left tympanic membrane normal;  Nose  External Nose: nares patent bilaterally; external nose normal;  Internal Nose: nasal mucosa normal; septum normal; bilateral inferior turbinates with hypertrophy;  Oral Cavity/Oropharynx  Lips: normal;  Teeth: normal;  Gums: gingiva normal;  Tongue: normal;  Oral mucosa: normal;  Hard palate: normal;  Soft palate: normal;  Tonsils: bilateral tonsils 2+, cryptic;  Base of Tongue: normal;  Posterior pharyngeal wall: normal;  Neck  Neck: neck normal; neck palpation normal;  Thyroid : thyroid   normal;  Respiratory  Inspection: breathing unlabored; normal breathing rate;  Lymphatic  Palpation: no cervical adenopathy noted;  Neurovestibular  Mental Status: alert and oriented;  Psychiatric: mood normal; affect is appropriate;  Cranial Nerves: cranial nerves intact;       Assessment:  ENCOUNTER DIAGNOSES     ICD-10-CM   1. Tonsillar and adenoid hypertrophy  J35.3   2. Chronic tonsillitis  J35.01   3. Painful swallowing  R13.10       Plan:  Medical records reviewed on 12/29/2023.  Reviewed antibiotic list from pharmacy and patient does not meet criteria for Tonsillectomy at this time.  Scope exam shows 2+ Adenoids, no significant airway obstruction.  Advised to continue to seek treatment for acute throat complaints and may meet criteria for T&A in the future      Orders Placed This Encounter    31575 - LARYNGOSCOPY, FLEXIBLE DIAGNOSTIC (AMB ONLY)     No follow-ups on file.    Eleanor Blazer, APRN, CNP  12/29/2023, 15:11        [1]   Patient Active Problem List  Diagnosis   (none) - all problems resolved or deleted   [2] No Known Allergies  [3]   Social History  Tobacco Use    Smoking status: Never    Smokeless tobacco: Never   Vaping Use    Vaping status: Never Used   Substance Use Topics    Alcohol use: Yes     Alcohol/week: 3.0 standard drinks of alcohol     Types: 3 Cans of beer per week     Comment: occasionally    Drug use: Never

## 2024-01-05 ENCOUNTER — Encounter (INDEPENDENT_AMBULATORY_CARE_PROVIDER_SITE_OTHER): Payer: Self-pay | Admitting: PHYSICIAN ASSISTANT

## 2024-01-05 ENCOUNTER — Other Ambulatory Visit (INDEPENDENT_AMBULATORY_CARE_PROVIDER_SITE_OTHER): Payer: Self-pay | Admitting: PHYSICIAN ASSISTANT

## 2024-01-06 ENCOUNTER — Other Ambulatory Visit (INDEPENDENT_AMBULATORY_CARE_PROVIDER_SITE_OTHER): Payer: Self-pay | Admitting: PHYSICIAN ASSISTANT

## 2024-01-06 MED ORDER — METFORMIN 500 MG TABLET: 500.0000 mg | ORAL_TABLET | Freq: Three times a day (TID) | ORAL | 1 refills | Status: DC

## 2024-01-06 MED ORDER — HYDROXYZINE PAMOATE 25 MG CAPSULE: 25.0000 mg | ORAL_CAPSULE | Freq: Three times a day (TID) | ORAL | 0 refills | Status: AC | PRN

## 2024-02-09 ENCOUNTER — Encounter (INDEPENDENT_AMBULATORY_CARE_PROVIDER_SITE_OTHER): Payer: Self-pay | Admitting: PHYSICIAN ASSISTANT

## 2024-02-09 ENCOUNTER — Other Ambulatory Visit: Payer: Self-pay

## 2024-02-09 ENCOUNTER — Ambulatory Visit: Payer: Self-pay | Attending: PHYSICIAN ASSISTANT | Admitting: PHYSICIAN ASSISTANT

## 2024-02-09 VITALS — BP 117/64 | HR 76 | Temp 97.1°F | Resp 17 | Ht 75.0 in | Wt 391.0 lb

## 2024-02-09 DIAGNOSIS — I10 Essential (primary) hypertension: Secondary | ICD-10-CM | POA: Insufficient documentation

## 2024-02-09 DIAGNOSIS — R7303 Prediabetes: Secondary | ICD-10-CM | POA: Insufficient documentation

## 2024-02-09 DIAGNOSIS — Z9884 Bariatric surgery status: Secondary | ICD-10-CM | POA: Insufficient documentation
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
## 2024-02-09 NOTE — Progress Notes (Signed)
 FAMILY [facility], MEDICAL OFFICE BUILDING  [address]  [place] [address]  Operated by [facility]    Progress Note    Corey Lopez  2000/06/03  [id number]    Date of Service: 02/09/2024  9:30 AM EST    Chief complaint:   Chief Complaint   Patient presents with    Follow Up     Patient here for a 7 week follow up visit. Patient is s/p gastric bypass 01/14/2024 with Dr. Veronica in [place], [place].        Subjective:     This is a case of a 24 y.o. year old male who comes in today for follow up after having bariatric surgery 01/14/2024 and follows back back next week. Pt.states he is doing well. No headaches, no dizziness, no SOB, wheezing. Pt.notices heartburn and reflux resolve with dietary changes.     Review of Systems   All systems reviewed and negative except for what is noted above.      Current Outpatient Medications   Medication Sig    busPIRone  (BUSPAR ) 15 mg Oral Tablet Take 1 Tablet (15 mg total) by mouth Three times a day (Patient taking differently: Take 1 Tablet (15 mg total) by mouth Daily)    ergocalciferol , vitamin D2, (DRISDOL) 1,250 mcg (50,000 unit) Oral Capsule Take 1 Capsule (50,000 Units total) by mouth Every 7 days    hydrOXYzine  pamoate (VISTARIL ) 25 mg Oral Capsule Take 1 Capsule (25 mg total) by mouth Three times a day as needed for Anxiety for up to 90 days    methocarbamoL  (ROBAXIN ) 750 mg Oral Tablet Take 1 Tablet (750 mg total) by mouth Twice per day as needed    omeprazole  (PRILOSEC) 40 mg Oral Capsule, Delayed Release(E.C.) Take 1 Capsule (40 mg total) by mouth Daily    polyethylene glycol (MIRALAX) 17 gram/dose Oral Powder     ursodioL (ACTIGALL) 300 mg Oral Capsule Take 1 Capsule (300 mg total) by mouth Twice daily    venlafaxine  (EFFEXOR  XR) 150 mg Oral Capsule, Sust. Release 24 hr Take 1 Capsule (150 mg total) by mouth Daily (Take along with the venlafaxine  75mg  capsule)       Objective:     BP 117/64 (Site: Left Arm, Patient Position: Sitting, Cuff Size:  Adult Large)   Pulse 76   Temp 36.2 C (97.1 F) (Temporal)   Resp 17   Ht 1.905 m (6' 3)   Wt (!) 177 kg (391 lb)   SpO2 97%   BMI 48.87 kg/m       BMI addressed: discussed and reviewed     Physical Exam  Vitals and nursing note reviewed.   Constitutional:       Appearance: Normal appearance.   HENT:      Head: Normocephalic and atraumatic.      Right Ear: Tympanic membrane normal.      Left Ear: Tympanic membrane normal.      Nose: Nose normal.      Mouth/Throat:      Mouth: Mucous membranes are moist.      Pharynx: Oropharynx is clear.   Eyes:      Extraocular Movements: Extraocular movements intact.      Pupils: Pupils are equal, round, and reactive to light.   Neck:      Vascular: No carotid bruit.   Cardiovascular:      Rate and Rhythm: Normal rate and regular rhythm.      Heart sounds: Normal heart sounds.  Pulmonary:      Effort: Pulmonary effort is normal.      Breath sounds: Normal breath sounds.   Abdominal:      General: Abdomen is flat. Bowel sounds are normal.      Palpations: Abdomen is soft.      Comments: Incision sites healed well   Musculoskeletal:         General: Normal range of motion.      Cervical back: Normal range of motion and neck supple.   Skin:     General: Skin is warm and dry.   Neurological:      General: No focal deficit present.      Mental Status: He is alert.   Psychiatric:         Mood and Affect: Mood normal.         Behavior: Behavior normal.           Assessment & Plan  HTN (hypertension)  Pt.taken off BP meds after surgery and BP doing well       Pre-diabetes  Pt.taken off Glucophage  post-surgery   Will monitor       H/O bariatric surgery  Continue follow with specialist  Continue follow exercise regiemen     check labs at next appointment                The patient was given the opportunity to ask questions and those questions were answered to the patient's satisfaction. The patient was encouraged to call with any additional questions or concerns.    Follow up:  Return in about 6 weeks (around 03/22/2024).    Gerardine Ammon, PA-C

## 2024-02-17 ENCOUNTER — Encounter (INDEPENDENT_AMBULATORY_CARE_PROVIDER_SITE_OTHER): Payer: Self-pay | Admitting: PHYSICIAN ASSISTANT

## 2024-02-22 ENCOUNTER — Other Ambulatory Visit (INDEPENDENT_AMBULATORY_CARE_PROVIDER_SITE_OTHER): Payer: Self-pay | Admitting: PHYSICIAN ASSISTANT

## 2024-02-22 MED ORDER — VENLAFAXINE ER 75 MG CAPSULE,EXTENDED RELEASE 24 HR: 75.0000 mg | ORAL_CAPSULE | Freq: Every day | ORAL | 0 refills | Status: AC

## 2024-02-28 ENCOUNTER — Other Ambulatory Visit (INDEPENDENT_AMBULATORY_CARE_PROVIDER_SITE_OTHER): Payer: Self-pay | Admitting: PHYSICIAN ASSISTANT

## 2024-02-28 MED ORDER — OMEPRAZOLE 40 MG CAPSULE,DELAYED RELEASE: 40.0000 mg | DELAYED_RELEASE_CAPSULE | Freq: Every day | ORAL | 0 refills | Status: AC

## 2024-03-22 ENCOUNTER — Ambulatory Visit (INDEPENDENT_AMBULATORY_CARE_PROVIDER_SITE_OTHER): Payer: Self-pay | Admitting: PHYSICIAN ASSISTANT

## 2024-06-27 ENCOUNTER — Ambulatory Visit (INDEPENDENT_AMBULATORY_CARE_PROVIDER_SITE_OTHER): Payer: Self-pay | Admitting: NURSE PRACTITIONER
